# Patient Record
Sex: Female | Born: 1970 | Race: White | Hispanic: No | Marital: Married | Smoking: Former smoker
Health system: Southern US, Community
[De-identification: ages and names within clinical notes are randomized; demographics above are authoritative.]

## PROBLEM LIST (undated history)

## (undated) DIAGNOSIS — C801 Malignant (primary) neoplasm, unspecified: Secondary | ICD-10-CM

## (undated) DIAGNOSIS — R011 Cardiac murmur, unspecified: Secondary | ICD-10-CM

## (undated) DIAGNOSIS — Z5189 Encounter for other specified aftercare: Secondary | ICD-10-CM

## (undated) DIAGNOSIS — D649 Anemia, unspecified: Secondary | ICD-10-CM

## (undated) HISTORY — PX: COLON SURGERY: SHX602

## (undated) HISTORY — PX: CHOLECYSTECTOMY: SHX55

## (undated) HISTORY — DX: Cardiac murmur, unspecified: R01.1

## (undated) HISTORY — PX: GASTRIC BYPASS: SHX52

## (undated) HISTORY — DX: Malignant (primary) neoplasm, unspecified: C80.1

## (undated) HISTORY — PX: TUBAL LIGATION: SHX77

## (undated) HISTORY — DX: Encounter for other specified aftercare: Z51.89

## (undated) HISTORY — DX: Anemia, unspecified: D64.9

---

## 2004-03-15 ENCOUNTER — Emergency Department (HOSPITAL_COMMUNITY): Admission: EM | Admit: 2004-03-15 | Discharge: 2004-03-15 | Payer: Self-pay | Admitting: Emergency Medicine

## 2005-09-02 ENCOUNTER — Other Ambulatory Visit: Admission: RE | Admit: 2005-09-02 | Discharge: 2005-09-02 | Payer: Self-pay | Admitting: Gynecology

## 2009-01-06 ENCOUNTER — Ambulatory Visit (HOSPITAL_COMMUNITY): Admission: RE | Admit: 2009-01-06 | Discharge: 2009-01-06 | Payer: Self-pay | Admitting: Family Medicine

## 2009-01-08 ENCOUNTER — Ambulatory Visit (HOSPITAL_COMMUNITY): Admission: RE | Admit: 2009-01-08 | Discharge: 2009-01-08 | Payer: Self-pay | Admitting: Family Medicine

## 2009-01-10 ENCOUNTER — Ambulatory Visit: Payer: Self-pay | Admitting: Oncology

## 2009-01-17 LAB — MORPHOLOGY

## 2009-01-17 LAB — COMPREHENSIVE METABOLIC PANEL
ALT: 11 U/L (ref 0–35)
AST: 25 U/L (ref 0–37)
Albumin: 4.1 g/dL (ref 3.5–5.2)
Alkaline Phosphatase: 66 U/L (ref 39–117)
Chloride: 106 mEq/L (ref 96–112)
Potassium: 3.4 mEq/L — ABNORMAL LOW (ref 3.5–5.3)
Sodium: 137 mEq/L (ref 135–145)
Total Protein: 7.2 g/dL (ref 6.0–8.3)

## 2009-01-17 LAB — CBC & DIFF AND RETIC
BASO%: 0.5 % (ref 0.0–2.0)
LYMPH%: 30.1 % (ref 14.0–49.7)
MCHC: 29.3 g/dL — ABNORMAL LOW (ref 31.5–36.0)
MONO#: 0.3 10*3/uL (ref 0.1–0.9)
Platelets: 173 10*3/uL (ref 145–400)
RBC: 4.13 10*6/uL (ref 3.70–5.45)
Retic %: 0.43 % — ABNORMAL LOW (ref 0.50–1.50)
WBC: 4.1 10*3/uL (ref 3.9–10.3)

## 2009-01-17 LAB — CHCC SMEAR

## 2009-01-19 LAB — PROTEIN ELECTROPHORESIS, SERUM
Albumin ELP: 59.2 % (ref 55.8–66.1)
Beta 2: 4.5 % (ref 3.2–6.5)
Beta Globulin: 7.1 % (ref 4.7–7.2)
Gamma Globulin: 16.3 % (ref 11.1–18.8)

## 2009-01-19 LAB — KAPPA/LAMBDA LIGHT CHAINS: Kappa:Lambda Ratio: 0.74 (ref 0.26–1.65)

## 2009-01-23 LAB — VITAMIN B12: Vitamin B-12: 322 pg/mL (ref 211–911)

## 2009-01-23 LAB — FOLATE: Folate: 11.3 ng/mL

## 2009-01-23 LAB — IRON AND TIBC
%SAT: 13 % — ABNORMAL LOW (ref 20–55)
TIBC: 422 ug/dL (ref 250–470)
UIBC: 366 ug/dL

## 2009-01-23 LAB — FERRITIN: Ferritin: 3 ng/mL — ABNORMAL LOW (ref 10–291)

## 2009-02-16 ENCOUNTER — Ambulatory Visit: Payer: Self-pay | Admitting: Oncology

## 2009-02-20 LAB — CBC WITH DIFFERENTIAL/PLATELET
Basophils Absolute: 0 10*3/uL (ref 0.0–0.1)
Eosinophils Absolute: 0.2 10*3/uL (ref 0.0–0.5)
HCT: 34.7 % — ABNORMAL LOW (ref 34.8–46.6)
HGB: 11.2 g/dL — ABNORMAL LOW (ref 11.6–15.9)
LYMPH%: 43.6 % (ref 14.0–49.7)
MONO#: 0.2 10*3/uL (ref 0.1–0.9)
NEUT#: 1.7 10*3/uL (ref 1.5–6.5)
NEUT%: 45.4 % (ref 38.4–76.8)
Platelets: 132 10*3/uL — ABNORMAL LOW (ref 145–400)
WBC: 3.7 10*3/uL — ABNORMAL LOW (ref 3.9–10.3)

## 2009-03-20 ENCOUNTER — Ambulatory Visit: Payer: Self-pay | Admitting: Oncology

## 2009-03-20 LAB — CBC WITH DIFFERENTIAL/PLATELET
BASO%: 0.5 % (ref 0.0–2.0)
HCT: 38.6 % (ref 34.8–46.6)
NEUT#: 2.2 10*3/uL (ref 1.5–6.5)
NEUT%: 50.8 % (ref 38.4–76.8)
Platelets: 163 10*3/uL (ref 145–400)
RBC: 4.39 10*6/uL (ref 3.70–5.45)
RDW: 19.8 % — ABNORMAL HIGH (ref 11.2–14.5)

## 2009-03-20 LAB — IRON AND TIBC
Iron: 43 ug/dL (ref 42–145)
TIBC: 285 ug/dL (ref 250–470)

## 2009-03-20 LAB — FERRITIN: Ferritin: 26 ng/mL (ref 10–291)

## 2009-05-14 ENCOUNTER — Ambulatory Visit: Payer: Self-pay | Admitting: Oncology

## 2009-05-16 LAB — CBC WITH DIFFERENTIAL/PLATELET
BASO%: 0.4 % (ref 0.0–2.0)
EOS%: 2.5 % (ref 0.0–7.0)
Eosinophils Absolute: 0.1 10*3/uL (ref 0.0–0.5)
HCT: 39 % (ref 34.8–46.6)
LYMPH%: 37.2 % (ref 14.0–49.7)
MCH: 34.1 pg — ABNORMAL HIGH (ref 25.1–34.0)
MCV: 98.1 fL (ref 79.5–101.0)
MONO#: 0.2 10*3/uL (ref 0.1–0.9)
NEUT#: 2.7 10*3/uL (ref 1.5–6.5)
RBC: 3.98 10*6/uL (ref 3.70–5.45)

## 2009-05-16 LAB — IRON AND TIBC
%SAT: 46 % (ref 20–55)
Iron: 151 ug/dL — ABNORMAL HIGH (ref 42–145)
TIBC: 331 ug/dL (ref 250–470)

## 2009-05-16 LAB — FERRITIN: Ferritin: 19 ng/mL (ref 10–291)

## 2009-08-15 ENCOUNTER — Emergency Department (HOSPITAL_COMMUNITY): Admission: EM | Admit: 2009-08-15 | Discharge: 2009-08-16 | Payer: Self-pay | Admitting: Emergency Medicine

## 2009-08-16 ENCOUNTER — Ambulatory Visit: Payer: Self-pay | Admitting: Cardiology

## 2009-08-16 ENCOUNTER — Inpatient Hospital Stay (HOSPITAL_COMMUNITY): Admission: EM | Admit: 2009-08-16 | Discharge: 2009-08-18 | Payer: Self-pay | Admitting: Emergency Medicine

## 2009-08-17 ENCOUNTER — Encounter (INDEPENDENT_AMBULATORY_CARE_PROVIDER_SITE_OTHER): Payer: Self-pay | Admitting: Internal Medicine

## 2010-05-19 LAB — CBC
HCT: 31.6 % — ABNORMAL LOW (ref 36.0–46.0)
HCT: 34.8 % — ABNORMAL LOW (ref 36.0–46.0)
HCT: 36.1 % (ref 36.0–46.0)
Hemoglobin: 10.5 g/dL — ABNORMAL LOW (ref 12.0–15.0)
Hemoglobin: 11.5 g/dL — ABNORMAL LOW (ref 12.0–15.0)
Hemoglobin: 12.5 g/dL (ref 12.0–15.0)
MCHC: 33.2 g/dL (ref 30.0–36.0)
MCHC: 33.3 g/dL (ref 30.0–36.0)
MCHC: 34.6 g/dL (ref 30.0–36.0)
MCV: 100 fL (ref 78.0–100.0)
MCV: 101.2 fL — ABNORMAL HIGH (ref 78.0–100.0)
Platelets: 105 10*3/uL — ABNORMAL LOW (ref 150–400)
Platelets: 77 10*3/uL — ABNORMAL LOW (ref 150–400)
Platelets: 87 10*3/uL — ABNORMAL LOW (ref 150–400)
RBC: 3.12 MIL/uL — ABNORMAL LOW (ref 3.87–5.11)
RBC: 3.7 MIL/uL — ABNORMAL LOW (ref 3.87–5.11)
RDW: 13.5 % (ref 11.5–15.5)
RDW: 13.6 % (ref 11.5–15.5)

## 2010-05-19 LAB — URINE CULTURE: Colony Count: NO GROWTH

## 2010-05-19 LAB — RAPID URINE DRUG SCREEN, HOSP PERFORMED
Amphetamines: NOT DETECTED
Barbiturates: NOT DETECTED
Benzodiazepines: NOT DETECTED
Cocaine: NOT DETECTED
Opiates: POSITIVE — AB

## 2010-05-19 LAB — CARDIAC PANEL(CRET KIN+CKTOT+MB+TROPI)
CK, MB: 0.8 ng/mL (ref 0.3–4.0)
CK, MB: 1.1 ng/mL (ref 0.3–4.0)
Relative Index: INVALID (ref 0.0–2.5)
Relative Index: INVALID (ref 0.0–2.5)
Total CK: 35 U/L (ref 7–177)
Troponin I: 0.02 ng/mL (ref 0.00–0.06)
Troponin I: <0.01 ng/mL (ref 0.00–0.06)
Troponin I: <0.01 ng/mL (ref 0.00–0.06)

## 2010-05-19 LAB — CULTURE, BLOOD (ROUTINE X 2)
Culture: NO GROWTH
Culture: NO GROWTH
Culture: NO GROWTH

## 2010-05-19 LAB — BASIC METABOLIC PANEL
CO2: 26 mEq/L (ref 19–32)
Chloride: 114 mEq/L — ABNORMAL HIGH (ref 96–112)
Creatinine, Ser: 0.58 mg/dL (ref 0.4–1.2)
GFR calc Af Amer: >60 mL/min (ref >60)
Glucose, Bld: 85 mg/dL (ref 70–99)

## 2010-05-19 LAB — COMPREHENSIVE METABOLIC PANEL
AST: 21 U/L (ref 0–37)
Albumin: 2.7 g/dL — ABNORMAL LOW (ref 3.5–5.2)
Albumin: 3.4 g/dL — ABNORMAL LOW (ref 3.5–5.2)
BUN: 10 mg/dL (ref 6–23)
BUN: 15 mg/dL (ref 6–23)
CO2: 23 mEq/L (ref 19–32)
Calcium: 8.2 mg/dL — ABNORMAL LOW (ref 8.4–10.5)
Creatinine, Ser: 0.6 mg/dL (ref 0.4–1.2)
Creatinine, Ser: 0.68 mg/dL (ref 0.4–1.2)
GFR calc Af Amer: >60 mL/min (ref >60)
Glucose, Bld: 111 mg/dL — ABNORMAL HIGH (ref 70–99)
Glucose, Bld: 116 mg/dL — ABNORMAL HIGH (ref 70–99)
Glucose, Bld: 83 mg/dL (ref 70–99)
Potassium: 3.8 mEq/L (ref 3.5–5.1)
Potassium: 4.1 mEq/L (ref 3.5–5.1)
Potassium: 4.3 mEq/L (ref 3.5–5.1)
Sodium: 136 mEq/L (ref 135–145)
Sodium: 140 mEq/L (ref 135–145)
Total Bilirubin: 0.3 mg/dL (ref 0.3–1.2)
Total Protein: 5.8 g/dL — ABNORMAL LOW (ref 6.0–8.3)

## 2010-05-19 LAB — URINE MICROSCOPIC-ADD ON

## 2010-05-19 LAB — MONONUCLEOSIS SCREEN: Mono Screen: NEGATIVE

## 2010-05-19 LAB — DIFFERENTIAL
Basophils Absolute: 0 10*3/uL (ref 0.0–0.1)
Basophils Relative: 0 % (ref 0–1)
Basophils Relative: 0 % (ref 0–1)
Eosinophils Absolute: 0 10*3/uL (ref 0.0–0.7)
Eosinophils Absolute: 0.1 10*3/uL (ref 0.0–0.7)
Eosinophils Absolute: 0.1 10*3/uL (ref 0.0–0.7)
Lymphs Abs: 0.9 10*3/uL (ref 0.7–4.0)
Lymphs Abs: 1.1 10*3/uL (ref 0.7–4.0)
Monocytes Absolute: 0.3 10*3/uL (ref 0.1–1.0)
Monocytes Absolute: 0.7 10*3/uL (ref 0.1–1.0)
Monocytes Relative: 9 % (ref 3–12)
Monocytes Relative: 9 % (ref 3–12)
Neutrophils Relative %: 71 % (ref 43–77)
Neutrophils Relative %: 78 % — ABNORMAL HIGH (ref 43–77)

## 2010-05-19 LAB — CSF CELL COUNT WITH DIFFERENTIAL: Tube #: 2

## 2010-05-19 LAB — URINALYSIS, ROUTINE W REFLEX MICROSCOPIC
Glucose, UA: NEGATIVE mg/dL
Leukocytes, UA: NEGATIVE
Nitrite: NEGATIVE
Protein, ur: 30 mg/dL — AB
Specific Gravity, Urine: 1.027 (ref 1.005–1.030)
Specific Gravity, Urine: 1.027 (ref 1.005–1.030)
Urobilinogen, UA: 1 mg/dL (ref 0.0–1.0)
pH: 5 (ref 5.0–8.0)

## 2010-05-19 LAB — CULTURE, BLOOD (SINGLE)

## 2010-05-19 LAB — HEMOGLOBIN A1C
Hgb A1c MFr Bld: 5.5 % (ref <5.7)
Mean Plasma Glucose: 111 mg/dL (ref <117)

## 2010-05-19 LAB — CSF CULTURE W GRAM STAIN

## 2010-05-19 LAB — LIPID PANEL
LDL Cholesterol: 68 mg/dL (ref 0–99)
Total CHOL/HDL Ratio: 3 RATIO
Triglycerides: 59 mg/dL (ref <150)
VLDL: 12 mg/dL (ref 0–40)

## 2010-05-19 LAB — POCT PREGNANCY, URINE: Preg Test, Ur: NEGATIVE

## 2010-06-05 LAB — CROSSMATCH

## 2010-10-15 IMAGING — CT HEAD^01A_HEAD_SEQUENCE (ADULT)
1 series · 16 of 30 positions shown, 20 images · non-contrast
Comparison: None.

CLINICAL DATA: Fever, frontal headache

CT HEAD WITHOUT CONTRAST
TECHNIQUE: Contiguous axial images were obtained from the base of
the skull through the vertex without contrast.

[Series 2: head_seq 4.5 h37s st · axial · 0.43mm/px · z∈[+911,+1055]mm · 16 of 36 slices shown, 20 images]
[im 2/36  brain]
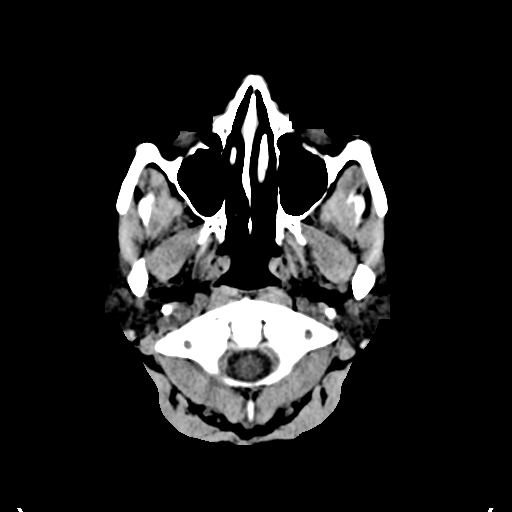
[im 2/36  bone]
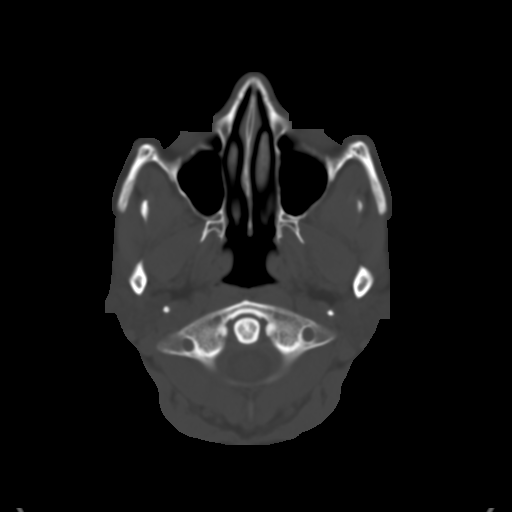
[im 4/36  brain]
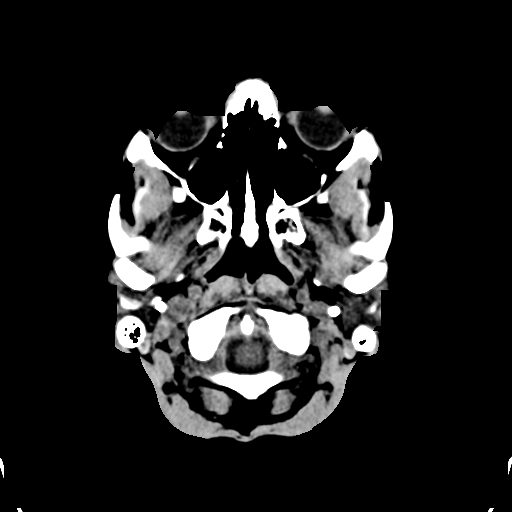
[im 7/36  brain]
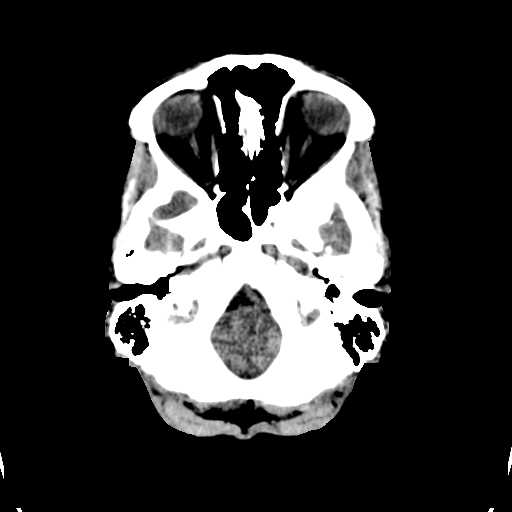
[im 9/36  brain]
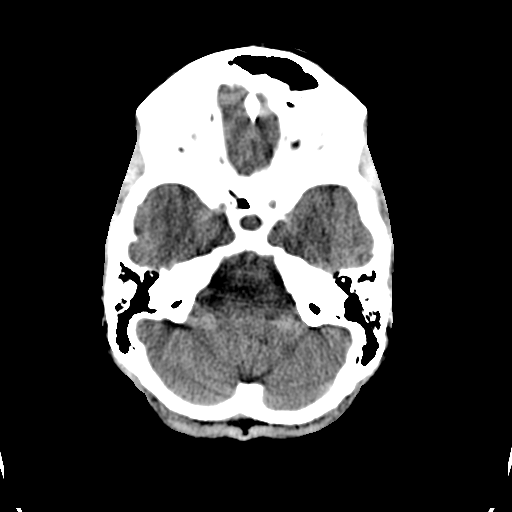
[im 10/36  brain]
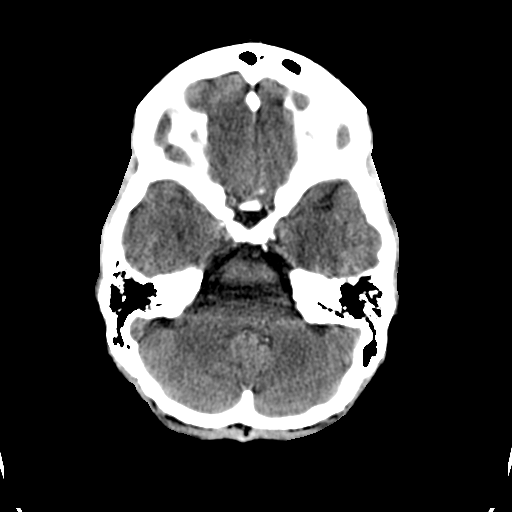
[im 10/36  bone]
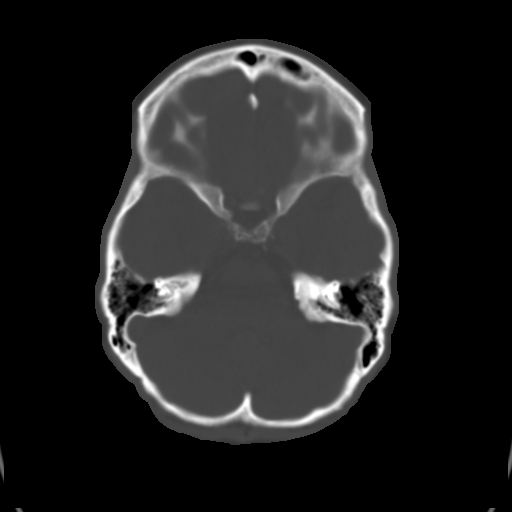
[im 13/36  brain]
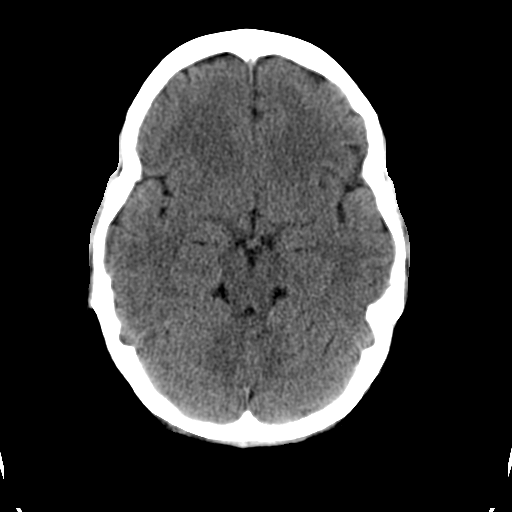
[im 15/36  brain]
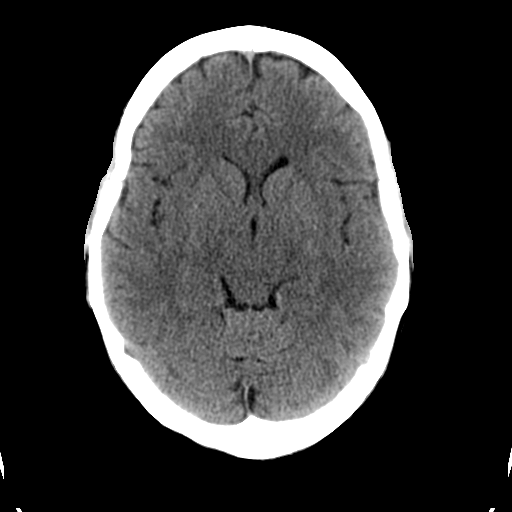
[im 17/36  brain]
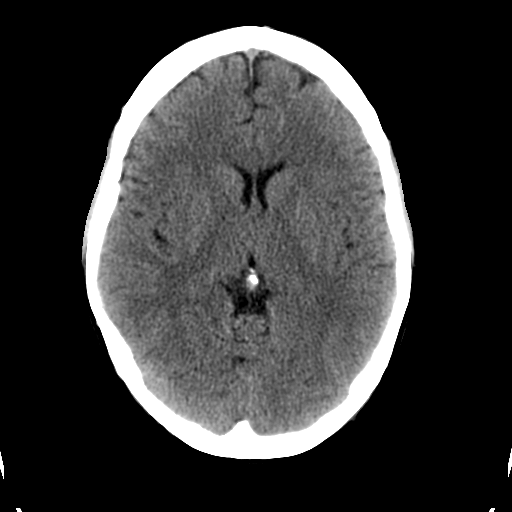
[im 19/36  brain]
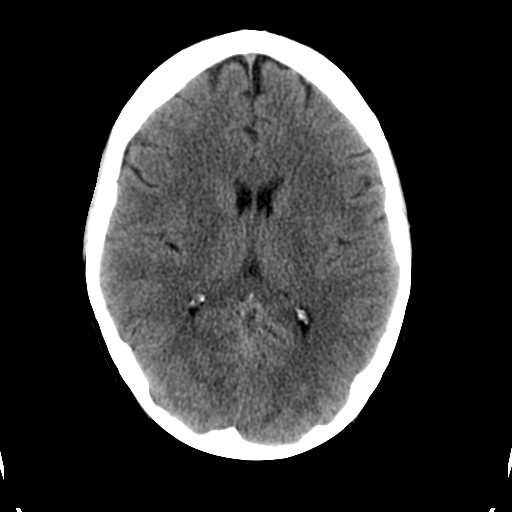
[im 19/36  bone]
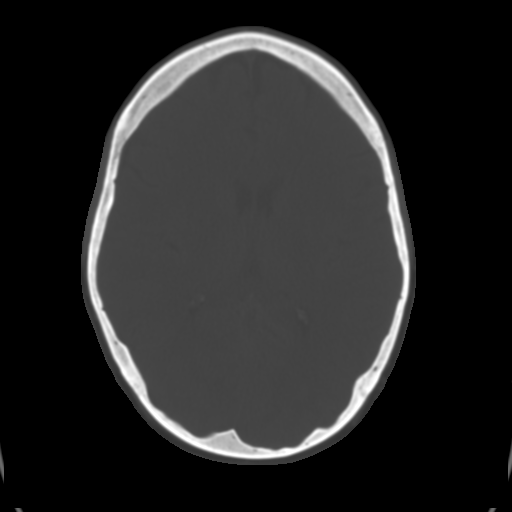
[im 21/36  brain]
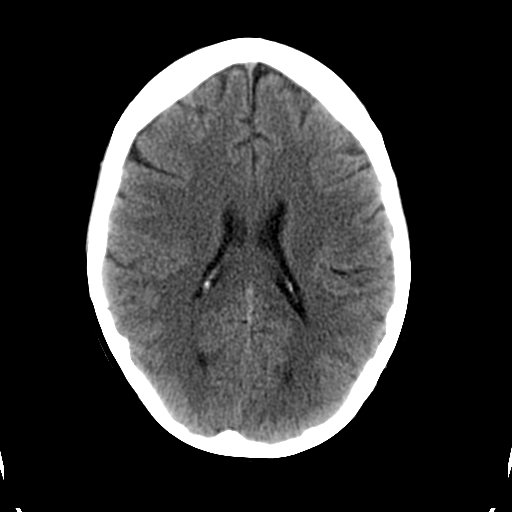
[im 23/36  brain]
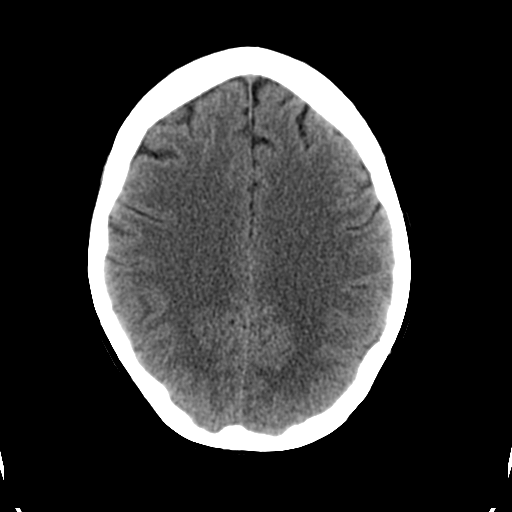
[im 26/36  brain]
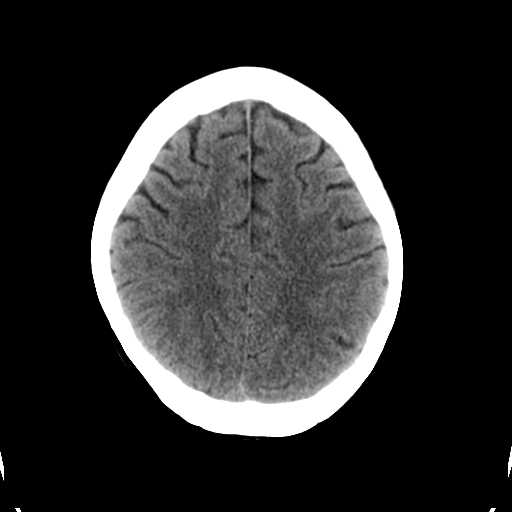
[im 27/36  brain]
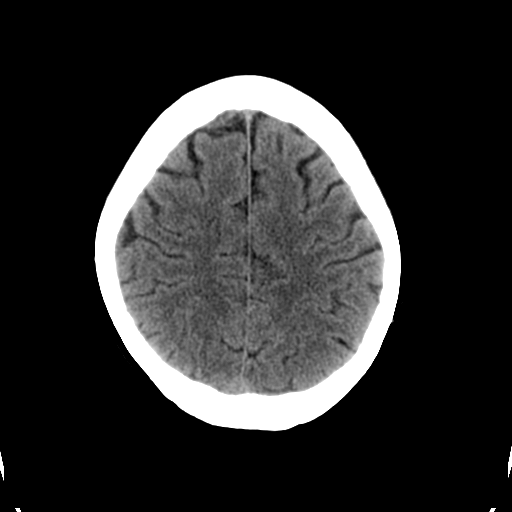
[im 27/36  bone]
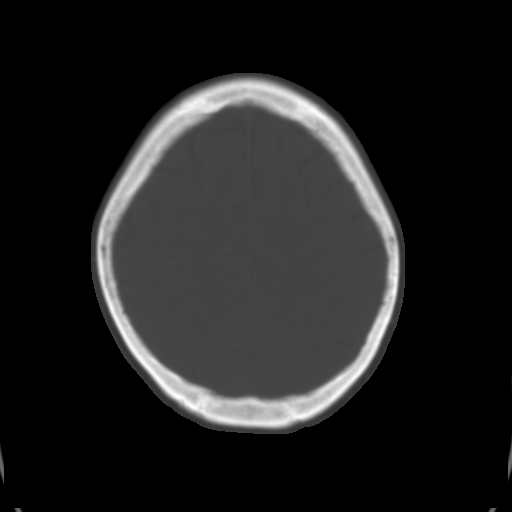
[im 29/36  brain]
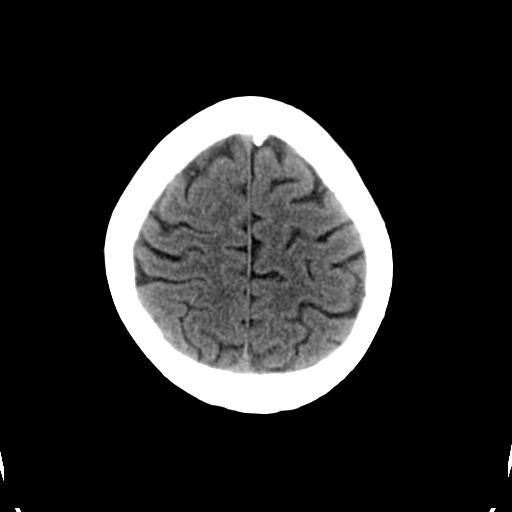
[im 32/36  brain]
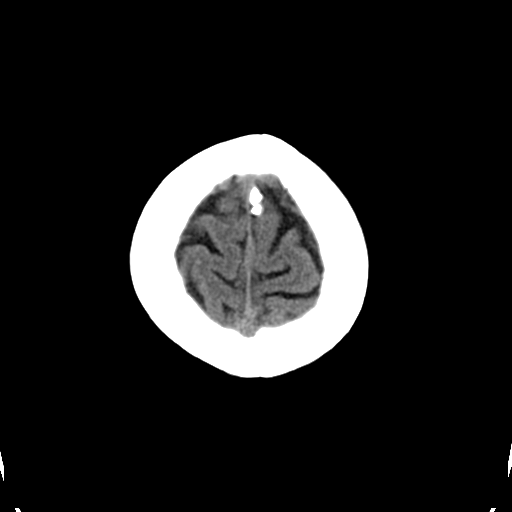
[im 34/36  brain]
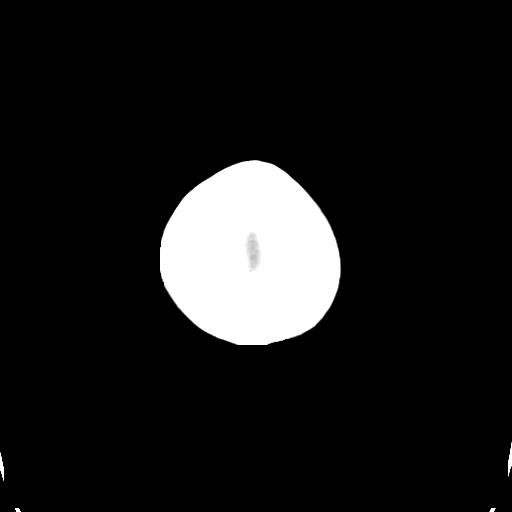

[16 of 30 positions shown; findings below may reference images not displayed]

FINDINGS: No evidence of intracranial hemorrhage.  No focal mass
lesion.  No CT evidence of acute infarction.  No midline shift or
mass effect.  No hydrocephalus.

Paranasal sinuses mastoid air cells are clear.  Orbits are normal.
IMPRESSION: Normal head CT

## 2014-03-21 ENCOUNTER — Ambulatory Visit (INDEPENDENT_AMBULATORY_CARE_PROVIDER_SITE_OTHER): Payer: BLUE CROSS/BLUE SHIELD | Admitting: Internal Medicine

## 2014-03-21 ENCOUNTER — Ambulatory Visit (INDEPENDENT_AMBULATORY_CARE_PROVIDER_SITE_OTHER): Payer: BLUE CROSS/BLUE SHIELD

## 2014-03-21 VITALS — BP 142/84 | HR 69 | Temp 98.9°F | Resp 18 | Ht 63.0 in | Wt 142.0 lb

## 2014-03-21 DIAGNOSIS — D6489 Other specified anemias: Secondary | ICD-10-CM

## 2014-03-21 DIAGNOSIS — R5383 Other fatigue: Secondary | ICD-10-CM

## 2014-03-21 DIAGNOSIS — Z9884 Bariatric surgery status: Secondary | ICD-10-CM

## 2014-03-21 LAB — POCT URINALYSIS DIPSTICK
Bilirubin, UA: NEGATIVE
GLUCOSE UA: NEGATIVE
Ketones, UA: NEGATIVE
LEUKOCYTES UA: NEGATIVE
NITRITE UA: NEGATIVE
PROTEIN UA: NEGATIVE
Spec Grav, UA: 1.01
Urobilinogen, UA: 0.2
pH, UA: 5

## 2014-03-21 LAB — POCT CBC
Granulocyte percent: 68.6 %G (ref 37–80)
HEMATOCRIT: 28 % — AB (ref 37.7–47.9)
Hemoglobin: 9 g/dL — AB (ref 12.2–16.2)
LYMPH, POC: 1.2 (ref 0.6–3.4)
MCH, POC: 24.3 pg — AB (ref 27–31.2)
MCHC: 32 g/dL (ref 31.8–35.4)
MCV: 75.8 fL — AB (ref 80–97)
MID (CBC): 0.2 (ref 0–0.9)
MPV: 8.9 fL (ref 0–99.8)
POC Granulocyte: 3.2 (ref 2–6.9)
POC LYMPH %: 26.2 % (ref 10–50)
POC MID %: 5.2 %M (ref 0–12)
Platelet Count, POC: 146 10*3/uL (ref 142–424)
RBC: 3.7 M/uL — AB (ref 4.04–5.48)
RDW, POC: 19.7 %
WBC: 4.7 10*3/uL (ref 4.6–10.2)

## 2014-03-21 LAB — COMPREHENSIVE METABOLIC PANEL
ALT: 10 U/L (ref 0–35)
AST: 22 U/L (ref 0–37)
Albumin: 4.1 g/dL (ref 3.5–5.2)
Alkaline Phosphatase: 55 U/L (ref 39–117)
BUN: 7 mg/dL (ref 6–23)
CO2: 24 mEq/L (ref 19–32)
Calcium: 9 mg/dL (ref 8.4–10.5)
Chloride: 107 mEq/L (ref 96–112)
Creat: 0.56 mg/dL (ref 0.50–1.10)
Glucose, Bld: 84 mg/dL (ref 70–99)
Potassium: 4.3 mEq/L (ref 3.5–5.3)
Sodium: 139 mEq/L (ref 135–145)
Total Bilirubin: 0.4 mg/dL (ref 0.2–1.2)
Total Protein: 6.9 g/dL (ref 6.0–8.3)

## 2014-03-21 LAB — POCT UA - MICROSCOPIC ONLY
BACTERIA, U MICROSCOPIC: NEGATIVE
CRYSTALS, UR, HPF, POC: NEGATIVE
Casts, Ur, LPF, POC: NEGATIVE
MUCUS UA: NEGATIVE
YEAST UA: NEGATIVE

## 2014-03-21 LAB — IRON AND TIBC
%SAT: 7 % — AB (ref 20–55)
Iron: 30 ug/dL — ABNORMAL LOW (ref 42–145)
TIBC: 441 ug/dL (ref 250–470)
UIBC: 411 ug/dL — ABNORMAL HIGH (ref 125–400)

## 2014-03-21 LAB — TSH: TSH: 1.475 u[IU]/mL (ref 0.350–4.500)

## 2014-03-21 LAB — HEPATITIS C ANTIBODY: HCV Ab: NEGATIVE

## 2014-03-21 LAB — GLUCOSE, POCT (MANUAL RESULT ENTRY): POC Glucose: 97 mg/dl (ref 70–99)

## 2014-03-21 LAB — POCT SEDIMENTATION RATE: POCT SED RATE: 21 mm/hr (ref 0–22)

## 2014-03-21 LAB — VITAMIN B12: VITAMIN B 12: 281 pg/mL (ref 211–911)

## 2014-03-21 LAB — MAGNESIUM: Magnesium: 1.9 mg/dL (ref 1.5–2.5)

## 2014-03-21 MED ORDER — FERROUS GLUCONATE 324 (38 FE) MG PO TABS: 324.0000 mg | ORAL_TABLET | Freq: Every day | ORAL | Status: DC

## 2014-03-21 NOTE — Patient Instructions (Signed)
Gastric Bypass Surgery, Care After Refer to this sheet in the next few weeks. These discharge instructions provide you with general information on caring for yourself after you leave the hospital. Your caregiver may also give you specific instructions. Your treatment has been planned according to the most current medical practices available, but unavoidable complications sometimes occur. If you have any problems or questions after discharge, call your caregiver. HOME CARE INSTRUCTIONS  Activity  Take frequent walks throughout the day. This will help to prevent blood clots. Do not sit for longer than 45 minutes to 1 hour while awake for 4 to 6 weeks after surgery.  Continue to do coughing and deep breathing exercises once you get home. This will help to prevent pneumonia.  Do not do strenuous activities, such as heavy lifting, pushing, or pulling, until after your follow-up visit with your caregiver. Do not lift anything heavier than 10 lb (4.5 kg).  Talk with your caregiver about when you may return to work and your exercise routine.  Do not drive while taking prescription pain medicine. Nutrition  It is very important that you drink at least 80 oz (2,400 mL) of fluid a day.  You should stay on a liquid diet until your follow-up visit with your caregiver. Keep sugar-free, liquid items on hand, including:  Tea: hot or cold. Drink only decaffeinated for the first month.  Broths: beef, chicken, vegetable.  Others: water, sugar-free frozen ice pops, flavored water, gelatin (after 1 week).  Do not consume caffeine for 1 month. Large amounts of caffeine can cause dehydration.  A dietician may also give you specific instructions.  Follow your caregiver's recommendations about vitamins and protein requirements after surgery. Hygiene  You may shower and wash your hair 2 days after surgery. Pat incisions dry. Do not rub incisions with a washcloth or towel.  Follow your caregiver's  recommendations about baths and pools following surgery. Pain control  If a prescription medicine was given, follow your caregiver's directions.  You may feel some gas pain caused by the carbon dioxide used to inflate your abdomen during surgery. This pain can be felt in your chest, shoulder, back, or abdominal area. Moving around often is advised. Incision care  You may have 4 or more small incisions. They are closed with skin adhesive strips. Skin adhesive strips can get wet and will fall off on their own. Check your incisions and surrounding area daily for any redness, swelling, discoloration, fluid (drainage), or bleeding. Dark red, dried blood may appear under these coverings. This is normal.  If you have a drain, it will be removed at your follow-up visit or before you leave the hospital.  If your drain is left in, follow your caregiver's instructions on drain care.  If your drain is taken out, keep a clean, dry bandage over the drain site. SEEK MEDICAL CARE IF:   You develop persistent nausea and vomiting.  You have pain and discomfort with swallowing.  You have pain, swelling, or warmth in the lower extremities.  You have an oral temperature above 102 F (38.9 C).  You develop chills.  Your incision sites look red, swollen, or have drainage.  Your stool is black, tarry, or maroon in color.  You are lightheaded when standing.  You notice a bruise getting larger.  You have any questions or concerns. SEEK IMMEDIATE MEDICAL CARE IF:   You have chest pain.  You have severe calf pain or pain not relieved by medicine.  You develop shortness of  breath or difficulty breathing.  There is bright red blood coming from the drain.  You feel confused.  You have slurred speech.  You suddenly feel weak. MAKE SURE YOU:   Understand these instructions.  Will watch your condition.  Will get help right away if you are not doing well or get worse. Document Released:  10/02/2003 Document Revised: 07/04/2013 Document Reviewed: 07/10/2009 [facility] Patient Information 2015 [place], [place]. This information is not intended to replace advice given to you by your health care provider. Make sure you discuss any questions you have with your health care provider.

## 2014-03-21 NOTE — Progress Notes (Signed)
Subjective:    Patient ID: Copeland Lapier, female    DOB: [birth year], 44 y.o.   MRN: [id number]  HPI Hx of gastric by pass in [facility] 12 years ago. Has not had primary care regula f/up. In 2011 had an extensive w/up for a serious infection cause never found altho it followed a blood transfusion. Having heavy irregular periods. No gyn. No primary care.No sxs of infection.     Review of Systems  Constitutional: Positive for activity change and fatigue. Negative for fever, chills, diaphoresis, appetite change and unexpected weight change.  HENT: Negative.   Eyes: Negative.   Respiratory: Negative.   Cardiovascular: Negative.   Gastrointestinal: Negative.   Endocrine: Positive for cold intolerance. Negative for heat intolerance, polydipsia, polyphagia and polyuria.  Genitourinary: Negative.   Musculoskeletal: Negative.   Skin: Negative.   Allergic/Immunologic: Negative.   Neurological: Positive for weakness. Negative for dizziness, tremors, seizures, syncope, facial asymmetry, speech difficulty, light-headedness, numbness and headaches.  Psychiatric/Behavioral: Negative.        Objective:   Physical Exam  Constitutional: She is oriented to person, place, and time. She appears well-developed and well-nourished. No distress.  HENT:  Head: Normocephalic.  Right Ear: External ear normal.  Left Ear: External ear normal.  Nose: Nose normal.  Mouth/Throat: Oropharynx is clear and moist.  Eyes: Conjunctivae and EOM are normal. Pupils are equal, round, and reactive to light.  Neck: Normal range of motion. Neck supple. No tracheal deviation present. No thyromegaly present.  Cardiovascular: Normal rate, regular rhythm, normal heart sounds and intact distal pulses.   Pulmonary/Chest: Effort normal and breath sounds normal.  Abdominal: Soft. Bowel sounds are normal. She exhibits no mass. There is no tenderness.  Musculoskeletal: Normal range of motion. She exhibits no edema or tenderness.   Neurological: She is alert and oriented to person, place, and time. No cranial nerve deficit. She exhibits normal muscle tone. Coordination normal.  Psychiatric: She has a normal mood and affect. Her behavior is normal. Judgment and thought content normal.   Results for orders placed or performed in visit on 03/21/14  POCT CBC  Result Value Ref Range   WBC 4.7 4.6 - 10.2 K/uL   Lymph, poc 1.2 0.6 - 3.4   POC LYMPH PERCENT 26.2 10 - 50 %L   MID (cbc) 0.2 0 - 0.9   POC MID % 5.2 0 - 12 %M   POC Granulocyte 3.2 2 - 6.9   Granulocyte percent 68.6 37 - 80 %G   RBC 3.70 (A) 4.04 - 5.48 M/uL   Hemoglobin 9.0 (A) 12.2 - 16.2 g/dL   HCT, POC 28.0 (A) 37.7 - 47.9 %   MCV 75.8 (A) 80 - 97 fL   MCH, POC 24.3 (A) 27 - 31.2 pg   MCHC 32.0 31.8 - 35.4 g/dL   RDW, POC 19.7 %   Platelet Count, POC 146 142 - 424 K/uL   MPV 8.9 0 - 99.8 fL  POCT UA - Microscopic Only  Result Value Ref Range   WBC, Ur, HPF, POC 0-1    RBC, urine, microscopic 0-2    Bacteria, U Microscopic neg    Mucus, UA neg    Epithelial cells, urine per micros 0-1    Crystals, Ur, HPF, POC neg    Casts, Ur, LPF, POC neg    Yeast, UA neg   POCT urinalysis dipstick  Result Value Ref Range   Color, UA yellow    Clarity, UA clear  Glucose, UA neg    Bilirubin, UA neg    Ketones, UA neg    Spec Grav, UA 1.010    Blood, UA trace    pH, UA 5.0    Protein, UA neg    Urobilinogen, UA 0.2    Nitrite, UA neg    Leukocytes, UA Negative   POCT glucose (manual entry)  Result Value Ref Range   POC Glucose 97 70 - 99 mg/dl   [facility] reading (PRIMARY) by  Dr Elder Cyphers normal cxr         Assessment & Plan:  Fatigue/Microcytic anemia Heavy Menses/needs gyn exam F/up 104 Post roux-en-y gastric bypass Probable nutritional deficiency/Iron deficiency

## 2014-03-22 LAB — VITAMIN D 25 HYDROXY (VIT D DEFICIENCY, FRACTURES): Vit D, 25-Hydroxy: 11 ng/mL — ABNORMAL LOW (ref 30–100)

## 2014-04-04 ENCOUNTER — Encounter: Payer: Self-pay | Admitting: Family Medicine

## 2014-04-04 ENCOUNTER — Encounter: Payer: BLUE CROSS/BLUE SHIELD | Admitting: Family Medicine

## 2014-04-05 NOTE — Progress Notes (Signed)
Patient left before being seen.

## 2015-02-12 IMAGING — CR DG CHEST 2V
2 series · 2 of 2 positions shown · non-contrast
Comparison: None.

CLINICAL DATA: Several month history of fatigue.

EXAM:
CHEST  2 VIEW

[PA]
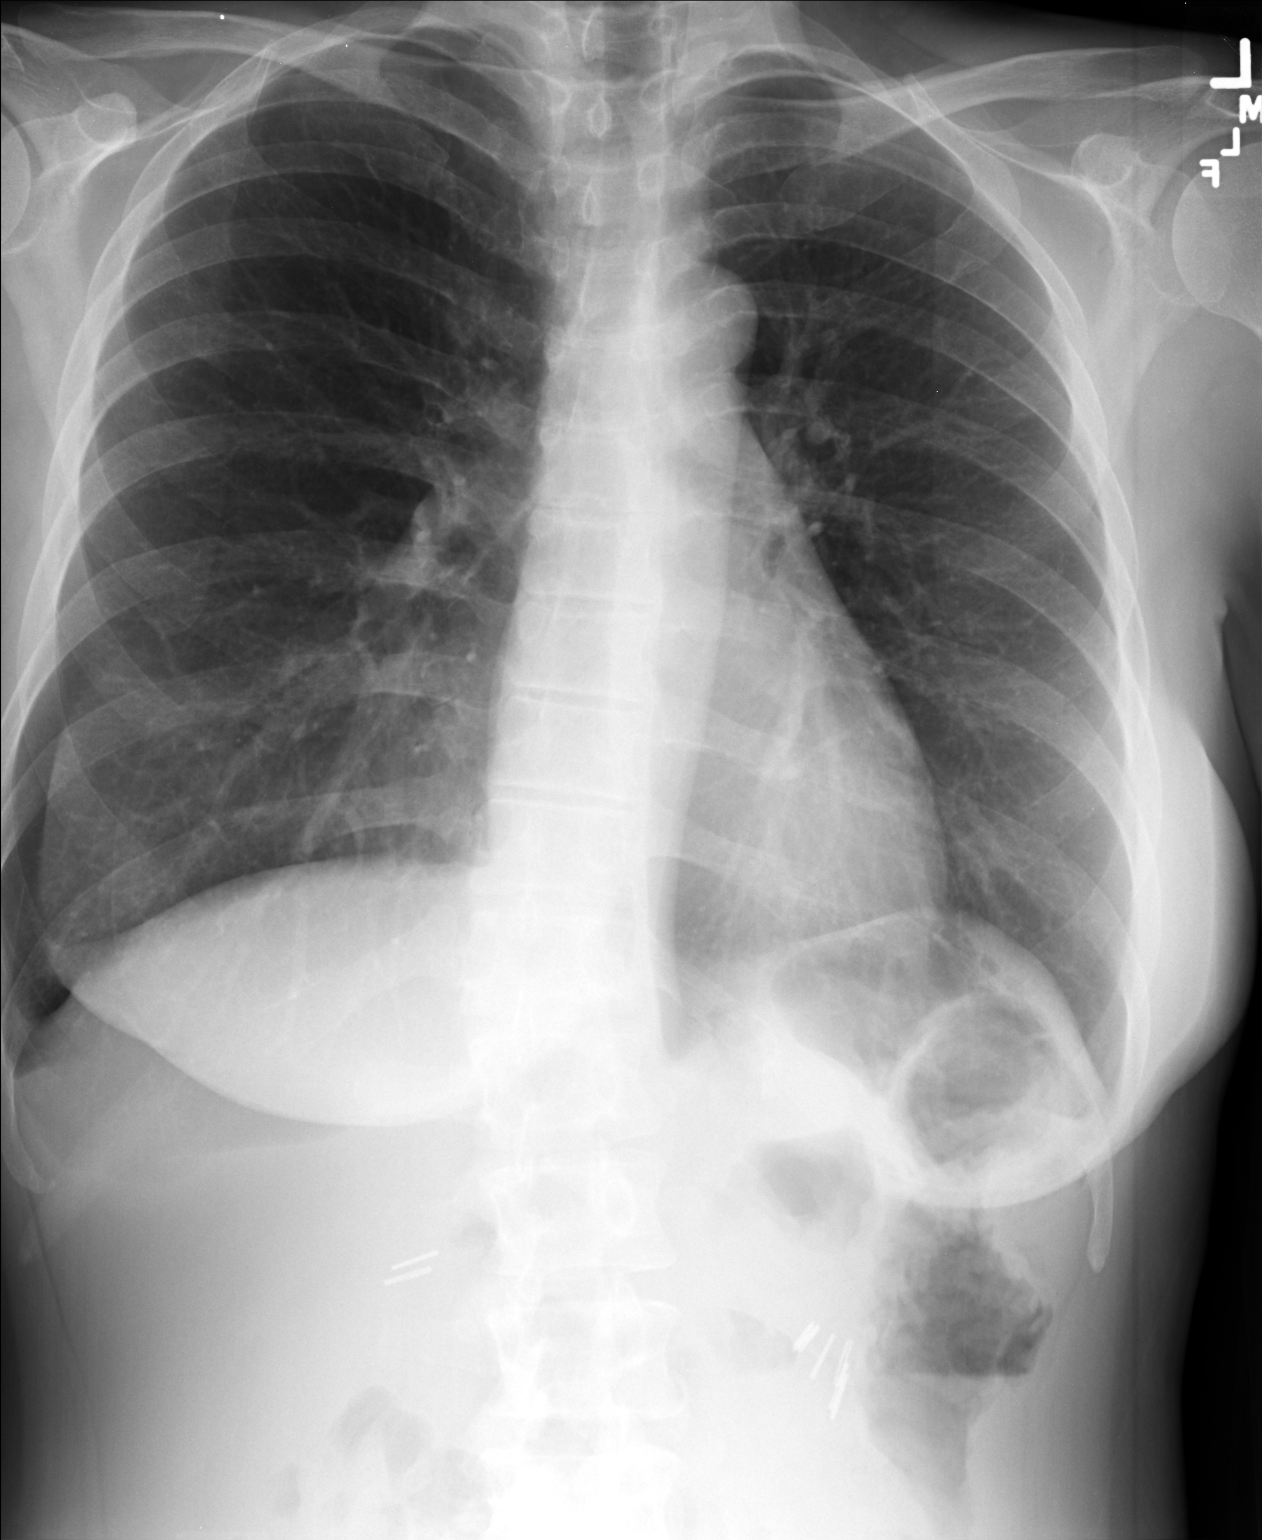

[lateral]
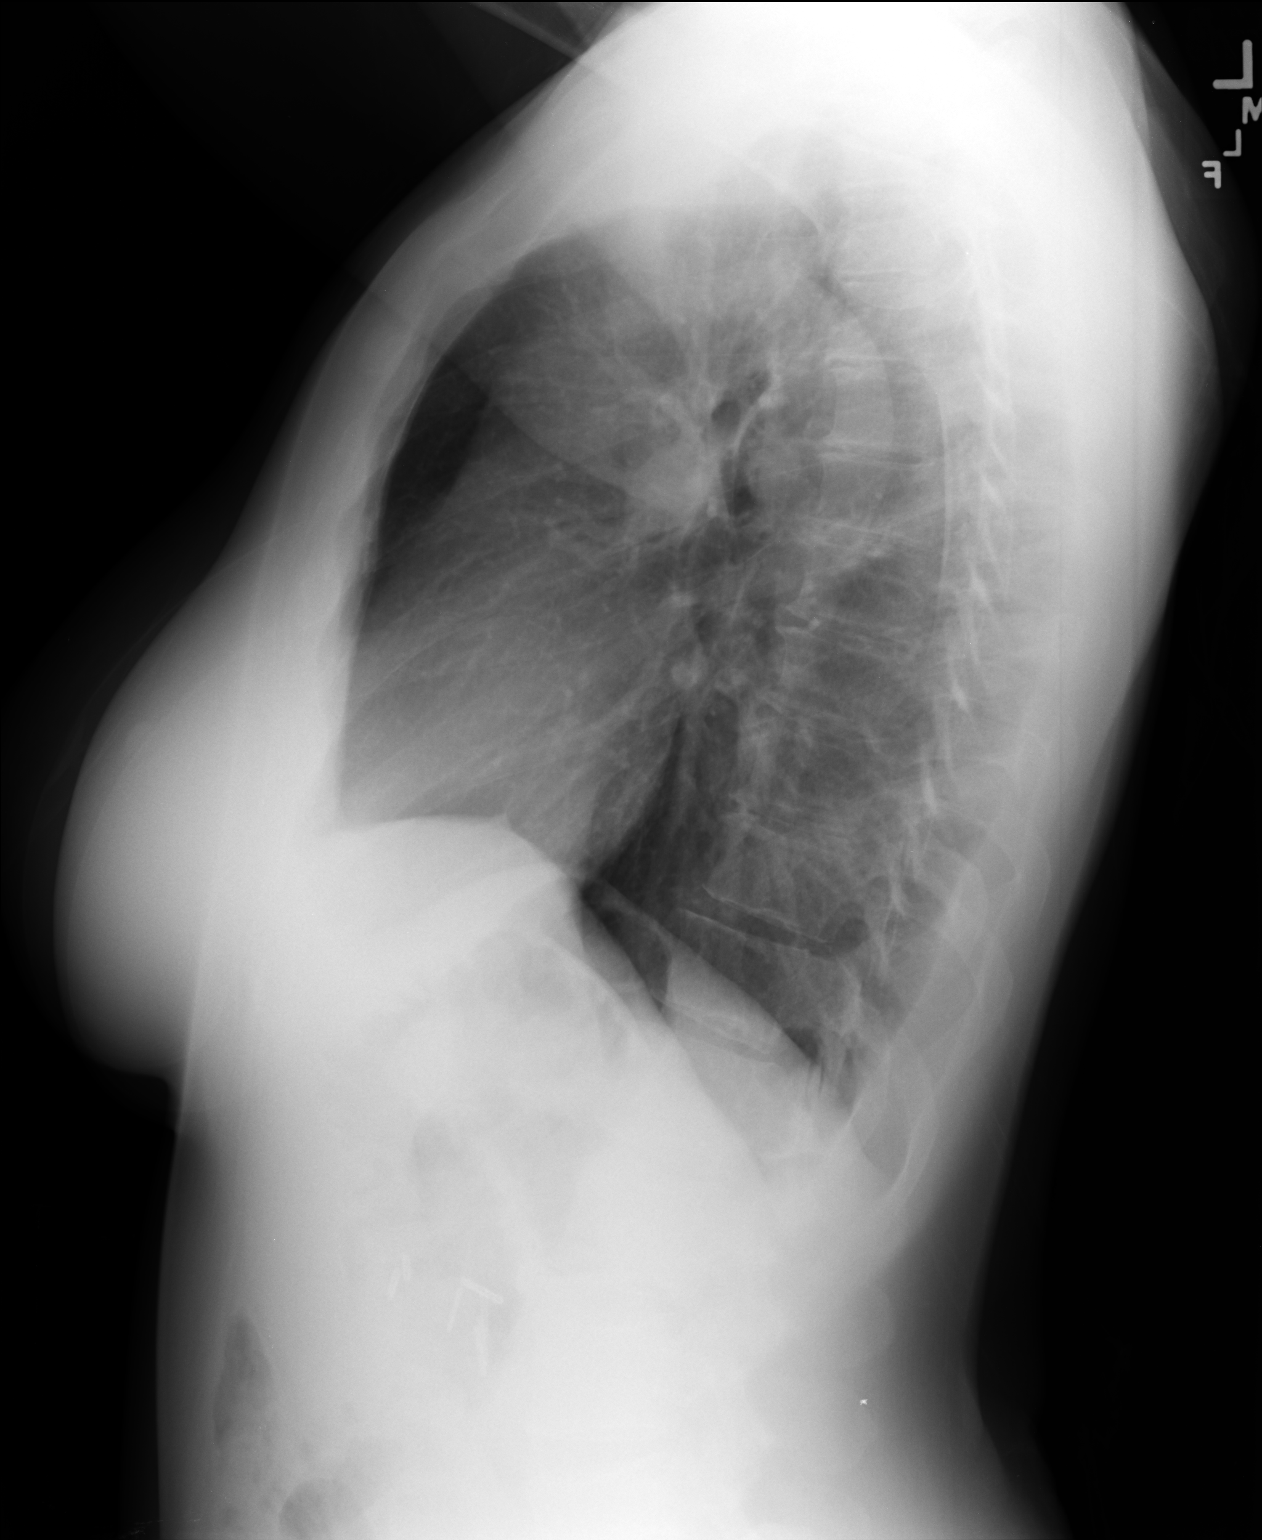

[2 of 2 positions shown; findings below may reference images not displayed]

FINDINGS: There is no edema or consolidation. Heart size and pulmonary
vascularity are normal. No adenopathy. There is thoracolumbar
dextroscoliosis.
IMPRESSION: No edema or consolidation.

## 2018-03-22 ENCOUNTER — Ambulatory Visit: Payer: Self-pay | Admitting: Family Medicine

## 2018-03-22 ENCOUNTER — Encounter: Payer: Self-pay | Admitting: Family Medicine

## 2018-03-22 VITALS — BP 136/80 | HR 84 | Ht 63.0 in | Wt 150.2 lb

## 2018-03-22 DIAGNOSIS — R000 Tachycardia, unspecified: Secondary | ICD-10-CM | POA: Insufficient documentation

## 2018-03-22 DIAGNOSIS — D508 Other iron deficiency anemias: Secondary | ICD-10-CM

## 2018-03-22 DIAGNOSIS — E559 Vitamin D deficiency, unspecified: Secondary | ICD-10-CM

## 2018-03-22 DIAGNOSIS — Z0000 Encounter for general adult medical examination without abnormal findings: Secondary | ICD-10-CM | POA: Insufficient documentation

## 2018-03-22 DIAGNOSIS — R0989 Other specified symptoms and signs involving the circulatory and respiratory systems: Secondary | ICD-10-CM

## 2018-03-22 DIAGNOSIS — E538 Deficiency of other specified B group vitamins: Secondary | ICD-10-CM

## 2018-03-22 DIAGNOSIS — Z9884 Bariatric surgery status: Secondary | ICD-10-CM | POA: Insufficient documentation

## 2018-03-22 DIAGNOSIS — R638 Other symptoms and signs concerning food and fluid intake: Secondary | ICD-10-CM

## 2018-03-22 DIAGNOSIS — Z8639 Personal history of other endocrine, nutritional and metabolic disease: Secondary | ICD-10-CM | POA: Insufficient documentation

## 2018-03-22 LAB — CBC
HEMATOCRIT: 29.1 % — AB (ref 36.0–46.0)
Hemoglobin: 9.1 g/dL — ABNORMAL LOW (ref 12.0–15.0)
MCHC: 31.4 g/dL (ref 30.0–36.0)
MCV: 74.6 fl — AB (ref 78.0–100.0)
Platelets: 153 10*3/uL (ref 150.0–400.0)
RBC: 3.9 Mil/uL (ref 3.87–5.11)
RDW: 18.4 % — ABNORMAL HIGH (ref 11.5–15.5)
WBC: 4.5 10*3/uL (ref 4.0–10.5)

## 2018-03-22 LAB — COMPREHENSIVE METABOLIC PANEL
ALBUMIN: 4 g/dL (ref 3.5–5.2)
ALT: 8 U/L (ref 0–35)
AST: 19 U/L (ref 0–37)
Alkaline Phosphatase: 58 U/L (ref 39–117)
BUN: 9 mg/dL (ref 6–23)
CO2: 25 mEq/L (ref 19–32)
CREATININE: 0.64 mg/dL (ref 0.40–1.20)
Calcium: 9.1 mg/dL (ref 8.4–10.5)
Chloride: 108 mEq/L (ref 96–112)
GFR: 98.37 mL/min (ref >60.00)
Glucose, Bld: 94 mg/dL (ref 70–99)
POTASSIUM: 4.8 meq/L (ref 3.5–5.1)
Sodium: 140 mEq/L (ref 135–145)
Total Bilirubin: 0.3 mg/dL (ref 0.2–1.2)
Total Protein: 6.6 g/dL (ref 6.0–8.3)

## 2018-03-22 LAB — LDL CHOLESTEROL, DIRECT: LDL DIRECT: 87 mg/dL

## 2018-03-22 LAB — URINALYSIS, ROUTINE W REFLEX MICROSCOPIC
Bilirubin Urine: NEGATIVE
Ketones, ur: NEGATIVE
Leukocytes, UA: NEGATIVE
Nitrite: NEGATIVE
Specific Gravity, Urine: 1.025 (ref 1.000–1.030)
Total Protein, Urine: NEGATIVE
URINE GLUCOSE: NEGATIVE
Urobilinogen, UA: 0.2 (ref 0.0–1.0)
pH: 5 (ref 5.0–8.0)

## 2018-03-22 LAB — LIPID PANEL
Cholesterol: 173 mg/dL (ref 0–200)
HDL: 72 mg/dL (ref >39.00)
LDL CALC: 88 mg/dL (ref 0–99)
NonHDL: 101.29
TRIGLYCERIDES: 67 mg/dL (ref 0.0–149.0)
Total CHOL/HDL Ratio: 2
VLDL: 13.4 mg/dL (ref 0.0–40.0)

## 2018-03-22 LAB — TSH: TSH: 1.72 u[IU]/mL (ref 0.35–4.50)

## 2018-03-22 LAB — VITAMIN B12: Vitamin B-12: 77 pg/mL — ABNORMAL LOW (ref 211–911)

## 2018-03-22 LAB — VITAMIN D 25 HYDROXY (VIT D DEFICIENCY, FRACTURES): VITD: 14.43 ng/mL — AB (ref 30.00–100.00)

## 2018-03-22 NOTE — Progress Notes (Addendum)
Established Patient Office Visit  Subjective:  Patient ID: Cheryl Pineda, female    DOB: May 25, 1970  Age: 48 y.o. MRN: [id number]  CC:  Chief Complaint  Patient presents with  . Establish Care    HPI Cheryl Pineda presents for establishment of care and for discussion of medical issues and concerns.  For the last 8 weeks or so she has had a feeling of a foreign body sensation in the back of her throat.  This sensation exists anywhere from the base of her neck up to the back of her throat.  She denies any type of dysphasia or reflux.  She does have a past medical history of Roux-en-Y bypass surgery.  She has not had follow-up for that procedure in some time now.  She also describes bouts of tachycardia night when she goes to bed.  She denies chest pain shortness of breath nausea or vomiting or diaphoresis with these episodes.  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  She actually gained clients with this move.  He lives with her husband.  Her children are grown their grandchildren.  She says that things are okay at home and with her kids.  She smokes a pack of cigarettes weekly.  She has 4 or 5 beers on the weekends.  Her father died 69s from what was presumed to be a massive coronary artery infarction.  He was a diabetic.  Her mom is still living.  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  She denies early morning stiffness.  She admits to salt craving recently.  She also has been chewing ice.  She has a history of severe iron deficiency anemia.  Patient described her loss of interest in things was due more to fatigue been feeling depressed sad.  Past Medical History:  Diagnosis Date  . Anemia   . Blood transfusion without reported diagnosis   . Cancer ([place])   . Heart murmur     Past Surgical History:  Procedure Laterality Date  . CHOLECYSTECTOMY    . COLON SURGERY    . GASTRIC BYPASS    . TUBAL LIGATION       Family History  Problem Relation Age of Onset  . Hyperlipidemia Mother   . Stroke Mother   . Hypertension Mother   . Diabetes Father   . Heart disease Father   . Early death Father   . Heart attack Father     Social History   Socioeconomic History  . Marital status: Married    Spouse name: Not on file  . Number of children: Not on file  . Years of education: Not on file  . Highest education level: Not on file  Occupational History  . Not on file  Social Needs  . Financial resource strain: Not on file  . Food insecurity:    Worry: Not on file    Inability: Not on file  . Transportation needs:    Medical: Not on file    Non-medical: Not on file  Tobacco Use  . Smoking status: Former Research scientist (life sciences)  . Smokeless tobacco: Never Used  Substance and Sexual Activity  . Alcohol use: Yes    Alcohol/week: 0.0 standard drinks    Comment: 5 beers over the weekend  . Drug use: No    Comment: One pack per week  . Sexual activity: Not on file  Lifestyle  .  Physical activity:    Days per week: Not on file    Minutes per session: Not on file  . Stress: Not on file  Relationships  . Social connections:    Talks on phone: Not on file    Gets together: Not on file    Attends religious service: Not on file    Active member of club or organization: Not on file    Attends meetings of clubs or organizations: Not on file    Relationship status: Not on file  . Intimate partner violence:    Fear of current or ex partner: Not on file    Emotionally abused: Not on file    Physically abused: Not on file    Forced sexual activity: Not on file  Other Topics Concern  . Not on file  Social History Narrative  . Not on file    Outpatient Medications Prior to Visit  Medication Sig Dispense Refill  . ferrous gluconate (FERGON) 324 MG tablet Take 1 tablet (324 mg total) by mouth daily with breakfast. 30 tablet 3   No facility-administered medications prior to visit.     Allergies    Allergen Reactions  . Aspirin Nausea And Vomiting  . Tape Rash    ROS Review of Systems  Constitutional: Negative for chills, diaphoresis, fatigue, fever and unexpected weight change.  HENT: Positive for voice change. Negative for postnasal drip, sore throat and trouble swallowing.   Eyes: Negative for photophobia and visual disturbance.  Respiratory: Negative.  Negative for shortness of breath and wheezing.   Cardiovascular: Positive for palpitations. Negative for chest pain.  Gastrointestinal: Negative for vomiting.  Endocrine: Negative for polyphagia and polyuria.  Genitourinary: Negative.   Musculoskeletal: Positive for arthralgias. Negative for gait problem.  Skin: Negative for pallor and rash.  Allergic/Immunologic: Negative for immunocompromised state.  Neurological: Negative for seizures, light-headedness and numbness.  Hematological: Does not bruise/bleed easily.  Psychiatric/Behavioral: Positive for dysphoric mood and sleep disturbance.   Depression screen [facility] 2/9 03/22/2018  Decreased Interest 3  Down, Depressed, Hopeless 0  PHQ - 2 Score 3  Altered sleeping 2  Tired, decreased energy 3  Change in appetite 0  Feeling bad or failure about yourself  0  Trouble concentrating 1  Moving slowly or fidgety/restless 0  Suicidal thoughts 0  PHQ-9 Score 9      Objective:    Physical Exam  Constitutional: She is oriented to person, place, and time. She appears well-developed and well-nourished. No distress.  HENT:  Head: Normocephalic and atraumatic.  Right Ear: External ear normal.  Left Ear: External ear normal.  Mouth/Throat: Oropharynx is clear and moist. No oropharyngeal exudate.  Eyes: Pupils are equal, round, and reactive to light. Conjunctivae are normal. Right eye exhibits no discharge. Left eye exhibits no discharge. No scleral icterus.  Neck: Neck supple. No JVD present. No tracheal deviation present. No thyromegaly present.  Cardiovascular: Normal rate and  regular rhythm.  Murmur heard. Pulmonary/Chest: Effort normal and breath sounds normal. No stridor.  Abdominal: Bowel sounds are normal.  Lymphadenopathy:    She has no cervical adenopathy.  Neurological: She is alert and oriented to person, place, and time.  Skin: Skin is warm and dry. She is not diaphoretic.  Psychiatric: She has a normal mood and affect. Her behavior is normal.    BP 136/80   Pulse 84   Ht 5\' 3"  (1.6 m)   Wt 150 lb 4 oz (68.2 kg)   SpO2 98%  BMI 26.62 kg/m  Wt Readings from Last 3 Encounters:  03/22/18 150 lb 4 oz (68.2 kg)  04/04/14 145 lb (65.8 kg)  03/21/14 142 lb (64.4 kg)   BP Readings from Last 3 Encounters:  03/22/18 136/80  04/04/14 (!) 150/80  03/21/14 (!) 142/84   Guideline developer:  UpToDate (see UpToDate for funding source) Date Released: June 2014  Health Maintenance Due  Topic Date Due  . HIV Screening  08/05/1983  . TETANUS/TDAP  08/05/1987  . PAP SMEAR-Modifier  08/04/1989  . INFLUENZA VACCINE  10/01/2017    There are no preventive care reminders to display for this patient.  Lab Results  Component Value Date   TSH 1.72 03/22/2018   Lab Results  Component Value Date   WBC 4.5 03/22/2018   HGB 9.1 (L) 03/22/2018   HCT 29.1 (L) 03/22/2018   MCV 74.6 (L) 03/22/2018   PLT 153.0 03/22/2018   Lab Results  Component Value Date   NA 140 03/22/2018   K 4.8 03/22/2018   CO2 25 03/22/2018   GLUCOSE 94 03/22/2018   BUN 9 03/22/2018   CREATININE 0.64 03/22/2018   BILITOT 0.3 03/22/2018   ALKPHOS 58 03/22/2018   AST 19 03/22/2018   ALT 8 03/22/2018   PROT 6.6 03/22/2018   ALBUMIN 4.0 03/22/2018   CALCIUM 9.1 03/22/2018   GFR 98.37 03/22/2018   Lab Results  Component Value Date   CHOL 173 03/22/2018   Lab Results  Component Value Date   HDL 72.00 03/22/2018   Lab Results  Component Value Date   LDLCALC 88 03/22/2018   Lab Results  Component Value Date   TRIG 67.0 03/22/2018   Lab Results  Component Value  Date   CHOLHDL 2 03/22/2018   Lab Results  Component Value Date   HGBA1C  08/17/2009    5.5 (NOTE)                                                                       According to the ADA Clinical Practice Recommendations for 2011, when HbA1c is used as a screening test:   >=6.5%   Diagnostic of Diabetes Mellitus           (if abnormal result  is confirmed)  5.7-6.4%   Increased risk of developing Diabetes Mellitus  References:Diagnosis and Classification of Diabetes Mellitus,Diabetes VVOH,6073,71(GGYIR 1):S62-S69 and Standards of Medical Care in         Diabetes - 2011,Diabetes Care,2011,34  (Suppl 1):S11-S61.      Assessment & Plan:   Problem List Items Addressed This Visit      Other   Health care maintenance - Primary   Relevant Orders   CBC (Completed)   Comprehensive metabolic panel (Completed)   LDL cholesterol, direct (Completed)   Lipid panel (Completed)   Urinalysis, Routine w reflex microscopic (Completed)   History of Roux-en-Y gastric bypass   Relevant Medications   Vitamin D, Ergocalciferol, (DRISDOL) 1.25 MG (50000 UT) CAPS capsule   ferrous sulfate 325 (65 FE) MG tablet   b complex vitamins capsule   Other Relevant Orders   CBC (Completed)   VITAMIN D 25 Hydroxy (Vit-D Deficiency, Fractures) (Completed)   Vitamin B12 (Completed)   Tachycardia  Relevant Orders   TSH (Completed)   Globus sensation   Relevant Orders   Ambulatory referral to ENT   History of iron deficiency   Relevant Medications   ferrous sulfate 325 (65 FE) MG tablet   Other Relevant Orders   Iron, TIBC and Ferritin Panel (Completed)   Salt craving   Relevant Orders   Cortisol    Other Visit Diagnoses    Vitamin D deficiency       Relevant Medications   Vitamin D, Ergocalciferol, (DRISDOL) 1.25 MG (50000 UT) CAPS capsule   B12 deficiency       Relevant Medications   b complex vitamins capsule   Iron deficiency anemia secondary to inadequate dietary iron intake       Relevant  Medications   ferrous sulfate 325 (65 FE) MG tablet   b complex vitamins capsule      Meds ordered this encounter  Medications  . Vitamin D, Ergocalciferol, (DRISDOL) 1.25 MG (50000 UT) CAPS capsule    Sig: Take 1 capsule (50,000 Units total) by mouth every 7 (seven) days.    Dispense:  5 capsule    Refill:  6  . ferrous sulfate 325 (65 FE) MG tablet    Sig: Take 1 tablet (325 mg total) by mouth daily with breakfast.    Dispense:  90 tablet    Refill:  3  . b complex vitamins capsule    Sig: Take 1 capsule by mouth daily.    Dispense:  90 capsule    Refill:  1    Follow-up: Return in about 3 months (around 06/21/2018), or if symptoms worsen or fail to improve.   Patient will return one morning for cortisol level.  She does have symptoms of adrenal dysfunction with the salt craving and take.  We will follow-up Roux-en-Y bypass surgery with indicated labs.  Health maintenance labs were drawn today.  She is 3-hours fasting.  Follow-up scheduled for 3 months or sooner if needed as indicated by lab work.

## 2018-03-23 DIAGNOSIS — D508 Other iron deficiency anemias: Secondary | ICD-10-CM | POA: Insufficient documentation

## 2018-03-23 DIAGNOSIS — E559 Vitamin D deficiency, unspecified: Secondary | ICD-10-CM | POA: Insufficient documentation

## 2018-03-23 DIAGNOSIS — E538 Deficiency of other specified B group vitamins: Secondary | ICD-10-CM | POA: Insufficient documentation

## 2018-03-23 LAB — IRON,TIBC AND FERRITIN PANEL
%SAT: 4 % (calc) — ABNORMAL LOW (ref 16–45)
Ferritin: 2 ng/mL — ABNORMAL LOW (ref 16–232)
IRON: 16 ug/dL — AB (ref 40–190)
TIBC: 403 mcg/dL (calc) (ref 250–450)

## 2018-03-23 MED ORDER — VITAMIN D (ERGOCALCIFEROL) 1.25 MG (50000 UNIT) PO CAPS: 50000.0000 [IU] | ORAL_CAPSULE | ORAL | 6 refills | Status: AC

## 2018-03-23 MED ORDER — FERROUS SULFATE 325 (65 FE) MG PO TABS: 325.0000 mg | ORAL_TABLET | Freq: Every day | ORAL | 3 refills | Status: AC

## 2018-03-23 MED ORDER — B COMPLEX VITAMINS PO CAPS: 1.0000 | ORAL_CAPSULE | Freq: Every day | ORAL | 1 refills | Status: AC

## 2018-03-23 NOTE — Addendum Note (Signed)
Addended by: Jon Billings on: 03/23/2018 08:02 AM   Modules accepted: Orders

## 2018-06-21 ENCOUNTER — Ambulatory Visit: Payer: Self-pay | Admitting: Family Medicine

## 2021-04-01 ENCOUNTER — Other Ambulatory Visit: Payer: Self-pay

## 2021-04-01 ENCOUNTER — Ambulatory Visit
Admission: EM | Admit: 2021-04-01 | Discharge: 2021-04-01 | Disposition: A | Discharge status: Home / Self Care | Payer: Self-pay | Attending: Urgent Care | Admitting: Urgent Care

## 2021-04-01 ENCOUNTER — Encounter: Payer: Self-pay | Admitting: Emergency Medicine

## 2021-04-01 DIAGNOSIS — K122 Cellulitis and abscess of mouth: Secondary | ICD-10-CM

## 2021-04-01 DIAGNOSIS — B349 Viral infection, unspecified: Secondary | ICD-10-CM

## 2021-04-01 LAB — POCT INFLUENZA A/B
Influenza A, POC: NEGATIVE
Influenza B, POC: NEGATIVE

## 2021-04-01 LAB — POCT RAPID STREP A (OFFICE): Rapid Strep A Screen: NEGATIVE

## 2021-04-01 MED ORDER — PREDNISONE 20 MG PO TABS: 20.0000 mg | ORAL_TABLET | Freq: Every day | ORAL | 0 refills | Status: AC

## 2021-04-01 NOTE — ED Triage Notes (Signed)
Sore throat, fever, headache, generalized body aches. States the last time she felt this bad she was septic. Child she is with was recently ill, and patient believes she contracted whatever the child had. Symptoms initially started 1/21, improved on 1/25, then got significantly worse on 1/28.

## 2021-04-01 NOTE — ED Provider Notes (Signed)
[name]    [name]: [id number] Arrival date & time: 04/01/21  0845      History   Chief Complaint Chief Complaint  Patient presents with   Fever   Sore Throat    HPI Cheryl Pineda is a 51 y.o. female.   Pleasant 51 year old female presents today with concerns of URI symptoms.  She states that her granddaughter who lives with them started having the symptoms 10 days ago.  Patient reports her symptoms initially started on January 21, but improved by the 25th.  She then states 2 days ago they flared up again worse than the first time.  She reports acute onset of headache, body aches, nasal congestion, sore throat, and fever.  She has been trying over-the-counter medications without relief, her main concern today is the headache which she states is unrelenting.  She denies any photophobia or nuchal rigidity.  She does not take any medications today and fever has resolved.  She denies any GI symptoms.  She is concerned that her granddaughter possibly recently had strep.   Fever Associated symptoms: congestion and headaches   Sore Throat Associated symptoms include headaches.  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      Home Medications    Prior to Admission medications   Medication Sig Start  Date End Date Taking? Authorizing Provider  predniSONE (DELTASONE) 20 MG tablet Take 1 tablet (20 mg total) by mouth daily with breakfast for 5 days. 04/01/21 04/06/21 Yes Mohammed Mcandrew L, PA  b complex vitamins capsule Take 1 capsule by mouth daily. 03/23/18   Libby Maw, MD  ferrous sulfate 325 (65 FE) MG tablet Take 1 tablet (325 mg total) by mouth daily with breakfast. 03/23/18   Libby Maw, MD  Vitamin D, Ergocalciferol, (DRISDOL) 1.25 MG (50000 UT) CAPS capsule Take 1 capsule (50,000 Units total) by mouth every 7 (seven) days. 03/23/18   Libby Maw, MD    Family History Family History  Problem Relation Age of Onset   Hyperlipidemia Mother    Stroke Mother    Hypertension Mother    Diabetes Father    Heart disease Father    Early death Father    Heart attack Father     Social History Social History   Tobacco Use   Smoking status: Former   Smokeless tobacco: Never  Substance Use Topics   Alcohol use: Yes    Alcohol/week: 0.0 standard drinks    Comment: 5 beers over the weekend   Drug use: No    Comment: One pack per week     Allergies   Aspirin and Tape   Review of Systems Review of Systems  Constitutional:  Positive for fever.  HENT:  Positive for congestion.   Neurological:  Positive for headaches.    Physical Exam Triage Vital Signs ED Triage Vitals [04/01/21 0949]  Enc Vitals Group     BP 114/80     Pulse Rate 71     Resp 16     Temp 98.3 F (36.8 C)     Temp Source Oral     SpO2 98 %     Weight      Height      Head Circumference      Peak Flow      Pain Score 7     Pain Loc      Pain Edu?      Excl. in [place]?    No data found.  Updated Vital Signs BP 114/80 (BP Location: Right Arm)    Pulse 71    Temp 98.3 F (36.8 C) (Oral)    Resp 16    SpO2 98%   Visual Acuity Right Eye Distance:   Left Eye Distance:   Bilateral Distance:    Right Eye Near:   Left Eye Near:    Bilateral Near:     Physical  Exam Vitals and nursing note reviewed.  Constitutional:      General: She is not in acute distress.    Appearance: She is well-developed and normal weight. She is not ill-appearing, toxic-appearing or diaphoretic.  HENT:     Head: Normocephalic and atraumatic.     Right Ear: Tympanic membrane, ear canal and external ear normal. No drainage, swelling or tenderness. No middle ear effusion. There is no impacted cerumen. Tympanic membrane is not erythematous.     Left Ear: Tympanic membrane, ear canal and external ear normal. No drainage, swelling or tenderness.  No middle ear effusion. There is no impacted cerumen. Tympanic membrane is not erythematous.     Nose: Nose normal. No congestion or rhinorrhea.     Mouth/Throat:     Mouth: Mucous membranes are moist. No oral lesions.     Pharynx: Oropharynx is clear. Uvula midline. Uvula swelling (mild with some erythema) present. No pharyngeal swelling, oropharyngeal exudate or posterior oropharyngeal erythema.     Tonsils: No tonsillar exudate or tonsillar abscesses.  Eyes:     General: No scleral icterus.       Right eye: No discharge.        Left eye: No discharge.     Extraocular Movements: Extraocular movements intact.     Right eye: Normal extraocular motion.     Left eye: Normal extraocular motion.     Conjunctiva/sclera: Conjunctivae normal.     Pupils: Pupils are equal, round, and reactive to light.  Neck:     Thyroid: No thyromegaly.  Cardiovascular:     Rate and Rhythm: Normal rate and regular rhythm.     Pulses: Normal pulses.     Heart sounds: Normal heart sounds. No murmur heard.   No gallop.  Pulmonary:     Effort: Pulmonary effort is normal. No respiratory distress.     Breath sounds: Normal breath sounds. No stridor. No wheezing, rhonchi or rales.  Chest:     Chest wall: No tenderness.  Abdominal:     General: Bowel sounds are normal. There is no distension.     Palpations: Abdomen is soft. There is no mass.      Tenderness: There is no abdominal tenderness. There is no rebound.  Musculoskeletal:  General: No swelling or tenderness. Normal range of motion.     Cervical back: Normal range of motion and neck supple. No tenderness.     Right lower leg: No edema.     Left lower leg: No edema.  Lymphadenopathy:     Cervical: No cervical adenopathy.  Skin:    General: Skin is warm and dry.     Capillary Refill: Capillary refill takes less than 2 seconds.     Findings: No erythema or rash.  Neurological:     Mental Status: She is alert.  Psychiatric:        Mood and Affect: Mood normal.     UC Treatments / Results  Labs (all labs ordered are listed, but only abnormal results are displayed) Labs Reviewed  POCT INFLUENZA A/B  POCT RAPID STREP A (OFFICE)    EKG   Radiology No results found.  Procedures Procedures (including critical care time)  Medications Ordered in UC Medications - No data to display  Initial Impression / Assessment and Plan / UC Course  I have reviewed the triage vital signs and the nursing notes.  Pertinent labs & imaging results that were available during my care of the patient were reviewed by me and considered in my medical decision making (see chart for details).     Viral syndrome -flu test negative in office, patient defers need for respiratory panel or COVID test Uvulitis -likely viral in nature, will start prednisone to aid with inflammation.  Alternate Tylenol and ibuprofen to help with headache  Final Clinical Impressions(s) / UC Diagnoses   Final diagnoses:  Viral illness  Uvulitis     Discharge Instructions      Your symptoms sound most consistent with a viral illness, likely parainfluenza or adenovirus. Supportive care is the mainstay of treatment - rest, hydration, frequent hand washing, tylenol or ibuprofen as needed for fever or aches. I have called in prednisone for you - start taking in the morning for the headaches and throat  pain. Do not take at night as it may keep you awake. F/U with PCP or RTC if symptoms last in total >7-10 days     ED Prescriptions     Medication Sig Dispense Auth. Provider   predniSONE (DELTASONE) 20 MG tablet Take 1 tablet (20 mg total) by mouth daily with breakfast for 5 days. 5 tablet Jacon Whetzel L, [place]      PDMP not reviewed this encounter.   Chaney Malling, [place] 04/01/21 2115

## 2021-04-01 NOTE — Discharge Instructions (Signed)
Your symptoms sound most consistent with a viral illness, likely parainfluenza or adenovirus. Supportive care is the mainstay of treatment - rest, hydration, frequent hand washing, tylenol or ibuprofen as needed for fever or aches. I have called in prednisone for you - start taking in the morning for the headaches and throat pain. Do not take at night as it may keep you awake. F/U with PCP or RTC if symptoms last in total >7-10 days

## 2022-03-01 ENCOUNTER — Ambulatory Visit
Admission: EM | Admit: 2022-03-01 | Discharge: 2022-03-01 | Disposition: A | Discharge status: Home / Self Care | Payer: Self-pay | Attending: Internal Medicine | Admitting: Internal Medicine

## 2022-03-01 ENCOUNTER — Encounter: Payer: Self-pay | Admitting: Emergency Medicine

## 2022-03-01 ENCOUNTER — Other Ambulatory Visit: Payer: Self-pay

## 2022-03-01 DIAGNOSIS — R300 Dysuria: Secondary | ICD-10-CM

## 2022-03-01 DIAGNOSIS — H9203 Otalgia, bilateral: Secondary | ICD-10-CM

## 2022-03-01 DIAGNOSIS — N10 Acute tubulo-interstitial nephritis: Secondary | ICD-10-CM

## 2022-03-01 LAB — POCT URINALYSIS DIP (MANUAL ENTRY)
Bilirubin, UA: NEGATIVE
Glucose, UA: NEGATIVE mg/dL
Nitrite, UA: NEGATIVE
Protein Ur, POC: >=300 mg/dL — AB
Spec Grav, UA: 1.02 (ref 1.010–1.025)
Urobilinogen, UA: 0.2 E.U./dL
pH, UA: 5.5 (ref 5.0–8.0)

## 2022-03-01 MED ORDER — SULFAMETHOXAZOLE-TRIMETHOPRIM 800-160 MG PO TABS: 1.0000 | ORAL_TABLET | Freq: Two times a day (BID) | ORAL | 0 refills | Status: AC

## 2022-03-01 MED ORDER — CEFTRIAXONE SODIUM 1 G IJ SOLR
1.0000 g | Freq: Once | INTRAMUSCULAR | Status: AC
  Administered 2022-03-01: 1 g via INTRAMUSCULAR

## 2022-03-01 NOTE — ED Provider Notes (Signed)
[name]    [name]: [id number] Arrival date & time: 03/01/22  1884      History   Chief Complaint Chief Complaint  Patient presents with   Fever    HPI Cheryl Pineda is a 51 y.o. female.   Patient presents with fever, dysuria, flank pain, bilateral ear pain.  Patient reports that dysuria started about 2 weeks ago and has been persistent.  She denies urinary frequency, abdominal pain, pelvic pain, vaginal discharge, hematuria, abnormal vaginal bleeding.  She reports that she developed a fever and bilateral back pain approximately 4 days ago.  She states that Tmax at home was 105 with a forehead thermometer.  He has taken Tylenol at 7 AM this morning for fever.  She also reports that she has bilateral ear pain that started about 4 to 5 days ago as well.  She denies any associated upper respiratory symptoms or cough.  She does report that her granddaughter had similar symptoms recently.  She is concerned given that she has history of sepsis due to an unknown infection in the past.   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      Home Medications    Prior to Admission medications   Medication Sig Start Date End Date Taking? Authorizing Provider  sulfamethoxazole-trimethoprim (BACTRIM  DS) 800-160 MG tablet Take 1 tablet by mouth 2 (two) times daily for 7 days. 03/01/22 03/08/22 Yes Anddy Wingert, [place], FNP  b complex vitamins capsule Take 1 capsule by mouth daily. 03/23/18   Libby Maw, MD  ferrous sulfate 325 (65 FE) MG tablet Take 1 tablet (325 mg total) by mouth daily with breakfast. 03/23/18   Libby Maw, MD  Vitamin D, Ergocalciferol, (DRISDOL) 1.25 MG (50000 UT) CAPS capsule Take 1 capsule (50,000 Units total) by mouth every 7 (seven) days. 03/23/18   Libby Maw, MD    Family History Family History  Problem Relation Age of Onset   Hyperlipidemia Mother    Stroke Mother    Hypertension Mother    Diabetes Father    Heart disease Father    Early death Father    Heart attack Father     Social History Social History   Tobacco Use   Smoking status: Former   Smokeless tobacco: Never  Substance Use Topics   Alcohol use: Yes    Alcohol/week: 0.0 standard drinks of alcohol    Comment: 5 beers over the weekend   Drug use: No    Comment: One pack per week     Allergies   Aspirin and Tape   Review of Systems Review of Systems Per HPI  Physical Exam Triage Vital Signs ED  Triage Vitals  Enc Vitals Group     BP 03/01/22 0908 (!) 143/90     Pulse Rate 03/01/22 0908 (!) 102     Resp 03/01/22 0908 18     Temp 03/01/22 0908 (!) 100.4 F (38 C)     Temp Source 03/01/22 0908 Oral     SpO2 03/01/22 0908 98 %     Weight --      Height --      Head Circumference --      Peak Flow --      Pain Score 03/01/22 0909 6     Pain Loc --      Pain Edu? --      Excl. in [place]? --    No data found.  Updated Vital Signs BP (!) 143/90 (BP Location: Left Arm)   Pulse (!) 102   Temp (!) 100.4 F (38 C) (Oral)   Resp 18   LMP 03/23/2014   SpO2 98%   Visual Acuity Right Eye Distance:   Left Eye Distance:   Bilateral Distance:    Right Eye Near:   Left Eye Near:    Bilateral Near:     Physical Exam Constitutional:      General:  She is not in acute distress.    Appearance: Normal appearance. She is not toxic-appearing or diaphoretic.  HENT:     Head: Normocephalic and atraumatic.     Right Ear: Tympanic membrane and ear canal normal. No middle ear effusion. No mastoid tenderness. Tympanic membrane is not perforated, erythematous or bulging.     Left Ear: Tympanic membrane and ear canal normal.  No middle ear effusion. No mastoid tenderness. Tympanic membrane is not perforated, erythematous or bulging.     Nose: Nose normal.     Mouth/Throat:     Mouth: Mucous membranes are moist.     Pharynx: No posterior oropharyngeal erythema.  Eyes:     Extraocular Movements: Extraocular movements intact.     Conjunctiva/sclera: Conjunctivae normal.  Cardiovascular:     Rate and Rhythm: Normal rate and regular rhythm.     Pulses: Normal pulses.     Heart sounds: Normal heart sounds.  Pulmonary:     Effort: Pulmonary effort is normal. No respiratory distress.     Breath sounds: Normal breath sounds.  Abdominal:     General: Bowel sounds are normal. There is no distension.     Palpations: Abdomen is soft.     Tenderness: There is no abdominal tenderness.  Musculoskeletal:       Back:     Comments: Tenderness to palpation to bilateral lower back.  No obvious swelling, discoloration, direct spinal tenderness, crepitus, step-off noted.  Neurological:     General: No focal deficit present.     Mental Status: She is alert and oriented to person, place, and time. Mental status is at baseline.  Psychiatric:        Mood and Affect: Mood normal.        Behavior: Behavior normal.        Thought Content: Thought content normal.        Judgment: Judgment normal.      UC Treatments / Results  Labs (all labs ordered are listed, but only abnormal results are displayed) Labs Reviewed  POCT URINALYSIS DIP (MANUAL ENTRY) - Abnormal; Notable for the following components:      Result Value   Ketones, POC UA trace (5) (*)     Blood, UA  small (*)    Protein Ur, POC >=300 (*)    Leukocytes, UA Trace (*)    All other components within normal limits  CBC  BASIC METABOLIC PANEL    EKG   Radiology No results found.  Procedures Procedures (including critical care time)  Medications Ordered in UC Medications  cefTRIAXone (ROCEPHIN) injection 1 g (1 g Intramuscular Given 03/01/22 0959)    Initial Impression / Assessment and Plan / UC Course  I have reviewed the triage vital signs and the nursing notes.  Pertinent labs & imaging results that were available during my care of the patient were reviewed by me and considered in my medical decision making (see chart for details).     Fever could be related to viral illness versus pyelonephritis.  Patient has trace leukocytes on UA which is indicative of possible urinary tract infection given associated symptoms.  Therefore, will treat for pyelonephritis with IM Rocephin and Bactrim.  Patient has large amount of protein in urine so will obtain BMP and CBC as well.  Patient was given strict ER precautions and advised to go straight to the ER if symptoms or fever persist or worsens in the next 24 to 48 hours given history of sepsis and associated fever.  Not sure if temp max of 105 at home was accurate given that it was completed by forehead thermometer.  Patient was advised to get an oral thermometer and monitor it very closely at home.  She was advised to ensure adequate fluid hydration and rest as well.  At this time, patient appears physically well so do not think that emergent evaluation is necessary.  There is no obvious abnormality to the ears on exam.  Unsure etiology of patient's ear pain.  Although, given patient's family member had a viral illness, this could be start of viral illness with associated fever.  Patient was given strict return precautions.  Patient verbalized understanding and was agreeable with plan. Final Clinical Impressions(s) / UC Diagnoses    Final diagnoses:  Acute pyelonephritis  Dysuria  Acute otalgia, bilateral     Discharge Instructions      I am suspicious that you have pyelonephritis which is a kidney infection.  I am treating you with an antibiotic injection as well as antibiotic pills that have been sent to the pharmacy.  Blood work is pending.  We will call if it is abnormal.  Please go straight to the emergency department if your fever is persistent or if you do not have any improvement in symptoms in the next 24 to 48 hours.    ED Prescriptions     Medication Sig Dispense Auth. Provider   sulfamethoxazole-trimethoprim (BACTRIM DS) 800-160 MG tablet Take 1 tablet by mouth 2 (two) times daily for 7 days. 14 tablet [place], Michele Rockers, [place]      PDMP not reviewed this encounter.   Teodora Medici, [place] 03/01/22 1122

## 2022-03-01 NOTE — Discharge Instructions (Signed)
I am suspicious that you have pyelonephritis which is a kidney infection.  I am treating you with an antibiotic injection as well as antibiotic pills that have been sent to the pharmacy.  Blood work is pending.  We will call if it is abnormal.  Please go straight to the emergency department if your fever is persistent or if you do not have any improvement in symptoms in the next 24 to 48 hours.

## 2022-03-01 NOTE — ED Triage Notes (Signed)
Pt here for fever x 4 days; pt sts UTI sx 1 week ago; pt sts now having flank pain; hx of sepsis in past

## 2022-03-02 LAB — BASIC METABOLIC PANEL
BUN/Creatinine Ratio: 11 (ref 9–23)
BUN: 9 mg/dL (ref 6–24)
CO2: 20 mmol/L (ref 20–29)
Calcium: 9 mg/dL (ref 8.7–10.2)
Chloride: 103 mmol/L (ref 96–106)
Creatinine, Ser: 0.8 mg/dL (ref 0.57–1.00)
Glucose: 96 mg/dL (ref 70–99)
Potassium: 4 mmol/L (ref 3.5–5.2)
Sodium: 139 mmol/L (ref 134–144)
eGFR: 88 mL/min/{1.73_m2} (ref >59)

## 2022-03-02 LAB — CBC
Hematocrit: 28.3 % — ABNORMAL LOW (ref 34.0–46.6)
Hemoglobin: 8.2 g/dL — ABNORMAL LOW (ref 11.1–15.9)
MCH: 20.9 pg — ABNORMAL LOW (ref 26.6–33.0)
MCHC: 29 g/dL — ABNORMAL LOW (ref 31.5–35.7)
MCV: 72 fL — ABNORMAL LOW (ref 79–97)
Platelets: 170 10*3/uL (ref 150–450)
RBC: 3.92 x10E6/uL (ref 3.77–5.28)
RDW: 19.8 % — ABNORMAL HIGH (ref 11.7–15.4)
WBC: 8 10*3/uL (ref 3.4–10.8)

## 2022-10-28 ENCOUNTER — Other Ambulatory Visit (HOSPITAL_BASED_OUTPATIENT_CLINIC_OR_DEPARTMENT_OTHER): Payer: Self-pay

## 2022-10-28 ENCOUNTER — Encounter (HOSPITAL_BASED_OUTPATIENT_CLINIC_OR_DEPARTMENT_OTHER): Payer: Self-pay | Admitting: Emergency Medicine

## 2022-10-28 ENCOUNTER — Emergency Department (HOSPITAL_BASED_OUTPATIENT_CLINIC_OR_DEPARTMENT_OTHER): Payer: BLUE CROSS/BLUE SHIELD

## 2022-10-28 ENCOUNTER — Other Ambulatory Visit: Payer: Self-pay

## 2022-10-28 ENCOUNTER — Emergency Department (HOSPITAL_BASED_OUTPATIENT_CLINIC_OR_DEPARTMENT_OTHER)
Admission: EM | Admit: 2022-10-28 | Discharge: 2022-10-28 | Disposition: A | Discharge status: Home / Self Care | Payer: BLUE CROSS/BLUE SHIELD | Attending: Emergency Medicine | Admitting: Emergency Medicine

## 2022-10-28 DIAGNOSIS — R1013 Epigastric pain: Secondary | ICD-10-CM

## 2022-10-28 DIAGNOSIS — Z87891 Personal history of nicotine dependence: Secondary | ICD-10-CM | POA: Insufficient documentation

## 2022-10-28 DIAGNOSIS — K2970 Gastritis, unspecified, without bleeding: Secondary | ICD-10-CM | POA: Insufficient documentation

## 2022-10-28 DIAGNOSIS — Z859 Personal history of malignant neoplasm, unspecified: Secondary | ICD-10-CM | POA: Insufficient documentation

## 2022-10-28 LAB — COMPREHENSIVE METABOLIC PANEL
ALT: 17 U/L (ref 0–44)
AST: 28 U/L (ref 15–41)
Albumin: 4.5 g/dL (ref 3.5–5.0)
Alkaline Phosphatase: 42 U/L (ref 38–126)
Anion gap: 9 (ref 5–15)
BUN: 13 mg/dL (ref 6–20)
CO2: 25 mmol/L (ref 22–32)
Calcium: 10.3 mg/dL (ref 8.9–10.3)
Chloride: 104 mmol/L (ref 98–111)
Creatinine, Ser: 0.67 mg/dL (ref 0.44–1.00)
GFR, Estimated: >60 mL/min (ref >60)
Glucose, Bld: 90 mg/dL (ref 70–99)
Potassium: 4.2 mmol/L (ref 3.5–5.1)
Sodium: 138 mmol/L (ref 135–145)
Total Bilirubin: 0.5 mg/dL (ref 0.3–1.2)
Total Protein: 7.3 g/dL (ref 6.5–8.1)

## 2022-10-28 LAB — URINALYSIS, ROUTINE W REFLEX MICROSCOPIC
Bacteria, UA: NONE SEEN
Bilirubin Urine: NEGATIVE
Glucose, UA: NEGATIVE mg/dL
Ketones, ur: NEGATIVE mg/dL
Nitrite: NEGATIVE
Protein, ur: NEGATIVE mg/dL
Specific Gravity, Urine: 1.007 (ref 1.005–1.030)
pH: 5 (ref 5.0–8.0)

## 2022-10-28 LAB — CBC
HCT: 40.9 % (ref 36.0–46.0)
Hemoglobin: 14.2 g/dL (ref 12.0–15.0)
MCH: 31.3 pg (ref 26.0–34.0)
MCHC: 34.7 g/dL (ref 30.0–36.0)
MCV: 90.3 fL (ref 80.0–100.0)
Platelets: 160 10*3/uL (ref 150–400)
RBC: 4.53 MIL/uL (ref 3.87–5.11)
RDW: 12.8 % (ref 11.5–15.5)
WBC: 6.2 10*3/uL (ref 4.0–10.5)
nRBC: 0 % (ref 0.0–0.2)

## 2022-10-28 LAB — LIPASE, BLOOD: Lipase: 34 U/L (ref 11–51)

## 2022-10-28 MED ORDER — SODIUM CHLORIDE 0.9 % IV BOLUS
1000.0000 mL | Freq: Once | INTRAVENOUS | Status: AC
  Administered 2022-10-28: 1000 mL via INTRAVENOUS

## 2022-10-28 MED ORDER — ONDANSETRON HCL 4 MG PO TABS
4.0000 mg | ORAL_TABLET | ORAL | 0 refills | Status: AC | PRN
  Filled 2022-10-28: qty 12, 2d supply, fill #0

## 2022-10-28 MED ORDER — LIDOCAINE VISCOUS HCL 2 % MT SOLN
15.0000 mL | Freq: Once | OROMUCOSAL | Status: AC
  Administered 2022-10-28: 15 mL via ORAL
  Filled 2022-10-28: qty 15

## 2022-10-28 MED ORDER — ALUM & MAG HYDROXIDE-SIMETH 200-200-20 MG/5ML PO SUSP
30.0000 mL | Freq: Once | ORAL | Status: AC
  Administered 2022-10-28: 30 mL via ORAL
  Filled 2022-10-28: qty 30

## 2022-10-28 MED ORDER — DIPHENHYDRAMINE HCL 50 MG/ML IJ SOLN
25.0000 mg | Freq: Once | INTRAMUSCULAR | Status: AC
  Administered 2022-10-28: 25 mg via INTRAVENOUS
  Filled 2022-10-28: qty 1

## 2022-10-28 MED ORDER — ONDANSETRON HCL 4 MG/2ML IJ SOLN
4.0000 mg | Freq: Once | INTRAMUSCULAR | Status: AC
  Administered 2022-10-28: 4 mg via INTRAVENOUS
  Filled 2022-10-28: qty 2

## 2022-10-28 MED ORDER — FAMOTIDINE IN NACL 20-0.9 MG/50ML-% IV SOLN
20.0000 mg | Freq: Once | INTRAVENOUS | Status: AC
  Administered 2022-10-28: 20 mg via INTRAVENOUS
  Filled 2022-10-28: qty 50

## 2022-10-28 MED ORDER — IOHEXOL 300 MG/ML  SOLN
100.0000 mL | Freq: Once | INTRAMUSCULAR | Status: AC | PRN
  Administered 2022-10-28: 80 mL via INTRAVENOUS

## 2022-10-28 MED ORDER — PANTOPRAZOLE SODIUM 40 MG PO TBEC
40.0000 mg | DELAYED_RELEASE_TABLET | Freq: Every day | ORAL | 0 refills | Status: AC
  Filled 2022-10-28: qty 14, 14d supply, fill #0

## 2022-10-28 NOTE — ED Triage Notes (Signed)
Pt arrives to ED with c/o upper abdominal pain x1 month. Pt notes constant burning sensation in abdomen.

## 2022-10-28 NOTE — ED Notes (Signed)
Gave patient some water. Call bell placed with in reach.

## 2022-10-28 NOTE — ED Notes (Signed)
Discharge paperwork given and verbally understood. 

## 2022-10-28 NOTE — ED Notes (Signed)
Pt aware of the need for a urine... Unable to currently provide the sample... 

## 2022-10-28 NOTE — ED Notes (Signed)
Pt returned from CT complaining of itching throughout and "red splotches"... Provider informed.Marland KitchenMarland Kitchen

## 2022-10-28 NOTE — ED Provider Notes (Signed)
[place] [facility] AT [facility] Provider Note  [name]: [id number] Arrival date & time: 10/28/22 1007  Chief Complaint(s) Abdominal Pain  HPI Cheryl Pineda is a 52 y.o. female with past medical history as below, significant for anemia, prior Roux-en-Y gastric bypass, cholecystectomy, IDA who presents to the ED with complaint of epigastric discomfort.  Patient reports epigastric burning sensation over the past month.  Intermittently worsens with p.o. intake.  She has nausea but no vomiting.  No sick or change to bowel or bladder function.  Does take iron supplements and does report chronically dark-colored stool.  No fevers.  No excessive or daily alcohol use.  No use of NSAIDs regularly.  She has been taking OTC antacids without much relief of her symptoms.  Does not follow gastroenterology or have PCP due to insurance issues.  No recent diet or medication changes.  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 Authorizing Provider  ondansetron (ZOFRAN) 4 MG tablet Take 1 tablet (4 mg total) by mouth every 4 (four) hours as needed for nausea or vomiting. 10/28/22  Yes Tanda Rockers A, DO  pantoprazole (PROTONIX) 40 MG tablet Take 1 tablet (40 mg total) by mouth daily for 14 days. 10/28/22 11/11/22 Yes Tanda Rockers A, DO  b  complex vitamins capsule Take 1 capsule by mouth daily. 03/23/18   Mliss Sax, MD  ferrous sulfate 325 (65 FE) MG tablet Take 1 tablet (325 mg total) by mouth daily with breakfast. 03/23/18   Mliss Sax, MD  Vitamin D, Ergocalciferol, (DRISDOL) 1.25 MG (50000 UT) CAPS capsule Take 1 capsule (50,000 Units total) by mouth every 7 (seven) days. 03/23/18   Mliss Sax, MD                                                                                                                                    Past Surgical History Past Surgical History:  Procedure Laterality Date   CHOLECYSTECTOMY     COLON SURGERY     GASTRIC BYPASS     TUBAL LIGATION     Family History Family History  Problem Relation Age of Onset   Hyperlipidemia Mother    Stroke Mother    Hypertension Mother  Diabetes Father    Heart disease Father    Early death Father    Heart attack Father     Social History Social History   Tobacco Use   Smoking status: Former   Smokeless tobacco: Never  Substance Use Topics   Alcohol use: Yes    Alcohol/week: 0.0 standard drinks of alcohol    Comment: 5 beers over the weekend   Drug use: No    Comment: One pack per week   Allergies Aspirin and Tape  Review of Systems Review of Systems  Constitutional:  Negative for activity change and fever.  HENT:  Negative for facial swelling and trouble swallowing.   Eyes:  Negative for discharge and redness.  Respiratory:  Negative for cough and shortness of breath.   Cardiovascular:  Negative for chest pain and palpitations.  Gastrointestinal:  Positive for abdominal pain and nausea. Negative for vomiting.  Genitourinary:  Negative for dysuria and flank pain.  Musculoskeletal:  Negative for back pain and gait problem.  Skin:  Negative for pallor and rash.  Neurological:  Negative for syncope and headaches.    Physical Exam Vital Signs  I have reviewed the triage vital signs BP 117/82 (BP  Location: Right Arm)   Pulse 63   Temp 97.8 F (36.6 C) (Oral)   Resp 19   Ht 5\' 4"  (1.626 m)   Wt 68 kg   LMP 03/23/2014   SpO2 100%   BMI 25.75 kg/m  Physical Exam Vitals and nursing note reviewed.  Constitutional:      General: She is not in acute distress.    Appearance: Normal appearance. She is well-developed.  HENT:     Head: Normocephalic and atraumatic.     Right Ear: External ear normal.     Left Ear: External ear normal.     Nose: Nose normal.     Mouth/Throat:     Mouth: Mucous membranes are moist.  Eyes:     General: No scleral icterus.       Right eye: No discharge.        Left eye: No discharge.  Cardiovascular:     Rate and Rhythm: Normal rate and regular rhythm.     Pulses: Normal pulses.     Heart sounds: Normal heart sounds.  Pulmonary:     Effort: Pulmonary effort is normal. No respiratory distress.     Breath sounds: Normal breath sounds. No stridor.  Abdominal:     General: Abdomen is flat. There is no distension.     Palpations: Abdomen is soft.     Tenderness: There is abdominal tenderness in the epigastric area.  Musculoskeletal:     Cervical back: No rigidity.     Right lower leg: No edema.     Left lower leg: No edema.  Skin:    General: Skin is warm and dry.     Capillary Refill: Capillary refill takes less than 2 seconds.  Neurological:     Mental Status: She is alert.  Psychiatric:        Mood and Affect: Mood normal.        Behavior: Behavior normal. Behavior is cooperative.     ED Results and Treatments Labs (all labs ordered are listed, but only abnormal results are displayed) Labs Reviewed  URINALYSIS, ROUTINE W REFLEX MICROSCOPIC - Abnormal; Notable for the following components:      Result Value   Color, Urine COLORLESS (*)    Hgb urine dipstick SMALL (*)  Leukocytes,Ua TRACE (*)    All other components within normal limits  LIPASE, BLOOD  COMPREHENSIVE METABOLIC PANEL  CBC                                                                                                                           Radiology CT ABDOMEN PELVIS W CONTRAST  Result Date: 10/28/2022 CLINICAL DATA:  Epigastric abdominal pain for 1 month. EXAM: CT ABDOMEN AND PELVIS WITH CONTRAST TECHNIQUE: Multidetector CT imaging of the abdomen and pelvis was performed using the standard protocol following bolus administration of intravenous contrast. RADIATION DOSE REDUCTION: This exam was performed according to the departmental dose-optimization program which includes automated exposure control, adjustment of the mA and/or kV according to patient size and/or use of iterative reconstruction technique. CONTRAST:  80mL OMNIPAQUE IOHEXOL 300 MG/ML  SOLN COMPARISON:  None Available. FINDINGS: Lower chest: No acute abnormality. Hepatobiliary: No focal liver abnormality is seen. Status post cholecystectomy. No biliary dilatation. Pancreas: Unremarkable. No pancreatic ductal dilatation or surrounding inflammatory changes. Spleen: Normal in size without focal abnormality. Adrenals/Urinary Tract: Adrenal glands are unremarkable. Kidneys are normal, without renal calculi, focal lesion, or hydronephrosis. Bladder is unremarkable. Stomach/Bowel: Status post gastric bypass. Appendix appears normal. There is no evidence of bowel obstruction or inflammation. Vascular/Lymphatic: Aortic atherosclerosis. No enlarged abdominal or pelvic lymph nodes. Reproductive: Uterus and bilateral adnexa are unremarkable. Other: No abdominal wall hernia or abnormality. No abdominopelvic ascites. Musculoskeletal: No acute or significant osseous findings. IMPRESSION: No acute abnormality seen in the abdomen or pelvis. Aortic Atherosclerosis (ICD10-I70.0). Electronically Signed   By: Lupita Raider M.D.   On: 10/28/2022 13:07    Pertinent labs & imaging results that were available during my care of the patient were reviewed by me and considered in my medical decision making (see MDM for  details).  Medications Ordered in ED Medications  alum & mag hydroxide-simeth (MAALOX/MYLANTA) 200-200-20 MG/5ML suspension 30 mL (30 mLs Oral Given 10/28/22 1141)    And  lidocaine (XYLOCAINE) 2 % viscous mouth solution 15 mL (15 mLs Oral Given 10/28/22 1141)  famotidine (PEPCID) IVPB 20 mg premix (0 mg Intravenous Stopped 10/28/22 1243)  sodium chloride 0.9 % bolus 1,000 mL (0 mLs Intravenous Stopped 10/28/22 1243)  iohexol (OMNIPAQUE) 300 MG/ML solution 100 mL (80 mLs Intravenous Contrast Given 10/28/22 1146)  diphenhydrAMINE (BENADRYL) injection 25 mg (25 mg Intravenous Given 10/28/22 1203)  ondansetron (ZOFRAN) injection 4 mg (4 mg Intravenous Given 10/28/22 1207)  Procedures Procedures  (including critical care time)  Medical Decision Making / ED Course    Medical Decision Making:    Cheryl Pineda is a 52 y.o. female with past medical history as below, significant for anemia, prior Roux-en-Y gastric bypass, cholecystectomy, IDA who presents to the ED with complaint of epigastric discomfort.. The complaint involves an extensive differential diagnosis and also carries with it a high risk of complications and morbidity.  Serious etiology was considered. Ddx includes but is not limited to: Differential diagnosis includes but is not exclusive to acute cholecystitis, intrathoracic causes for epigastric abdominal pain, gastritis, duodenitis, pancreatitis, small bowel or large bowel obstruction, abdominal aortic aneurysm, hernia, gastritis, etc.   Complete initial physical exam performed, notably the patient  was no acute distress, sitting upright, abdomen is nonperitoneal.    Reviewed and confirmed nursing documentation for past medical history, family history, social history.  Vital signs reviewed.    Narrative: 53 year old female history of Roux-en-Y,  cholecystectomy, artificially anemia here secondary to epigastric burning, discomfort Exam is stable, she is not peritoneal.  HDS. Collect screening labs, get CT imaging of the abdomen, GI cocktail, reassess  Clinical Course as of 10/28/22 1443  Tue Oct 28, 2022  1228 Pt developed few hives following CT imaging. No dyspnea or dysphagia, will give benadryl.  [SG]  1438 Feeling much better on recheck, tolerate p.o. intake without difficulty [SG]    Clinical Course User Index [SG] Sloan Leiter, DO    Labs and imaging stable.  She is tolerant p.o. intake.  Feeling much better. Concern for gastritis, possible PUD.  Start Protonix, encourage bland diet, Zofran, gastroenterology follow-up.  The patient improved significantly and was discharged in stable condition. Detailed discussions were had with the patient regarding current findings, and need for close f/u with PCP or on call doctor. The patient has been instructed to return immediately if the symptoms worsen in any way for re-evaluation. Patient verbalized understanding and is in agreement with current care plan. All questions answered prior to discharge.                   Additional history obtained: -Additional history obtained from na -External records from outside source obtained and reviewed including: Chart review including previous notes, labs, imaging, consultation notes including  Prior urgent care documentation, prior labs and imaging   Lab Tests: -I ordered, reviewed, and interpreted labs.   The pertinent results include:   Labs Reviewed  URINALYSIS, ROUTINE W REFLEX MICROSCOPIC - Abnormal; Notable for the following components:      Result Value   Color, Urine COLORLESS (*)    Hgb urine dipstick SMALL (*)    Leukocytes,Ua TRACE (*)    All other components within normal limits  LIPASE, BLOOD  COMPREHENSIVE METABOLIC PANEL  CBC    Notable for labs stable  EKG   EKG Interpretation Date/Time:     Ventricular Rate:    PR Interval:    QRS Duration:    QT Interval:    QTC Calculation:   R Axis:      Text Interpretation:           Imaging Studies ordered: I ordered imaging studies including CT abdomen pelvis I independently visualized the following imaging with scope of interpretation limited to determining acute life threatening conditions related to emergency care; findings noted above, significant for CT is stable I independently visualized and interpreted imaging. I agree with the radiologist interpretation   Medicines ordered and prescription  drug management: Meds ordered this encounter  Medications   AND Linked Order Group    alum & mag hydroxide-simeth (MAALOX/MYLANTA) 200-200-20 MG/5ML suspension 30 mL    lidocaine (XYLOCAINE) 2 % viscous mouth solution 15 mL   famotidine (PEPCID) IVPB 20 mg premix   sodium chloride 0.9 % bolus 1,000 mL   iohexol (OMNIPAQUE) 300 MG/ML solution 100 mL   diphenhydrAMINE (BENADRYL) injection 25 mg   ondansetron (ZOFRAN) injection 4 mg   pantoprazole (PROTONIX) 40 MG tablet    Sig: Take 1 tablet (40 mg total) by mouth daily for 14 days.    Dispense:  14 tablet    Refill:  0   ondansetron (ZOFRAN) 4 MG tablet    Sig: Take 1 tablet (4 mg total) by mouth every 4 (four) hours as needed for nausea or vomiting.    Dispense:  12 tablet    Refill:  0    -I have reviewed the patients home medicines and have made adjustments as needed   Consultations Obtained: na   Cardiac Monitoring: Continuous pulse oximetry interpreted by myself, 99% on RA.    Social Determinants of Health:  Diagnosis or treatment significantly limited by social determinants of health: no pcp, former smoker   Reevaluation: After the interventions noted above, I reevaluated the patient and found that they have improved  Co morbidities that complicate the patient evaluation  Past Medical History:  Diagnosis Date   Anemia    Blood transfusion without  reported diagnosis    Cancer (HCC)    Heart murmur       Dispostion: Disposition decision including need for hospitalization was considered, and patient discharged from emergency department.    Final Clinical Impression(s) / ED Diagnoses Final diagnoses:  Gastritis without bleeding, unspecified chronicity, unspecified gastritis type  Epigastric pain        Sloan Leiter, DO 10/28/22 1443

## 2022-10-28 NOTE — Discharge Instructions (Signed)
It was a pleasure caring for you today in the emergency department.  Please return to the emergency department for any worsening or worrisome symptoms.  Please take Protonix daily for 2 weeks, if you are still having symptoms after completion of this medication please follow-up with gastroenterology for further evaluation, you may benefit from endoscopy. In the interim eat a bland diet which means avoiding fried foods and spicy foods.  Avoiding NSAIDs and alcohol and tobacco.  Please return for any worsening or some symptoms

## 2022-10-28 NOTE — ED Notes (Signed)
Pt no longer has the itching... Provider informed.Marland KitchenMarland Kitchen

## 2023-03-16 ENCOUNTER — Other Ambulatory Visit (HOSPITAL_BASED_OUTPATIENT_CLINIC_OR_DEPARTMENT_OTHER): Payer: Self-pay

## 2023-04-15 LAB — HM MAMMOGRAPHY

## 2023-07-24 ENCOUNTER — Other Ambulatory Visit (HOSPITAL_COMMUNITY): Payer: Self-pay
